# Patient Record
Sex: Female | Born: 1970 | Race: White | Hispanic: No | Marital: Married | Smoking: Never smoker
Health system: Southern US, Community
[De-identification: ages and names within clinical notes are randomized; demographics above are authoritative.]

## PROBLEM LIST (undated history)

## (undated) DIAGNOSIS — C50919 Malignant neoplasm of unspecified site of unspecified female breast: Secondary | ICD-10-CM

## (undated) DIAGNOSIS — Z803 Family history of malignant neoplasm of breast: Secondary | ICD-10-CM

## (undated) DIAGNOSIS — F419 Anxiety disorder, unspecified: Secondary | ICD-10-CM

## (undated) DIAGNOSIS — Z808 Family history of malignant neoplasm of other organs or systems: Secondary | ICD-10-CM

## (undated) HISTORY — DX: Malignant neoplasm of unspecified site of unspecified female breast: C50.919

## (undated) HISTORY — DX: Family history of malignant neoplasm of breast: Z80.3

## (undated) HISTORY — DX: Anxiety disorder, unspecified: F41.9

## (undated) HISTORY — PX: TUBAL LIGATION: SHX77

## (undated) HISTORY — DX: Family history of malignant neoplasm of other organs or systems: Z80.8

## (undated) HISTORY — PX: ABDOMINAL HYSTERECTOMY: SHX81

---

## 2007-11-22 ENCOUNTER — Encounter: Admission: RE | Admit: 2007-11-22 | Discharge: 2007-11-22 | Payer: Self-pay | Admitting: Obstetrics and Gynecology

## 2007-12-04 ENCOUNTER — Encounter: Admission: RE | Admit: 2007-12-04 | Discharge: 2007-12-04 | Payer: Self-pay | Admitting: Obstetrics and Gynecology

## 2008-12-04 ENCOUNTER — Encounter: Admission: RE | Admit: 2008-12-04 | Discharge: 2008-12-04 | Payer: Self-pay | Admitting: Obstetrics and Gynecology

## 2009-12-14 ENCOUNTER — Encounter: Admission: RE | Admit: 2009-12-14 | Discharge: 2009-12-14 | Payer: Self-pay | Admitting: Obstetrics and Gynecology

## 2010-06-13 ENCOUNTER — Encounter: Payer: Self-pay | Admitting: Obstetrics and Gynecology

## 2010-12-09 ENCOUNTER — Other Ambulatory Visit: Payer: Self-pay | Admitting: Obstetrics and Gynecology

## 2010-12-09 DIAGNOSIS — Z803 Family history of malignant neoplasm of breast: Secondary | ICD-10-CM

## 2010-12-09 DIAGNOSIS — Z1231 Encounter for screening mammogram for malignant neoplasm of breast: Secondary | ICD-10-CM

## 2011-01-06 ENCOUNTER — Ambulatory Visit
Admission: RE | Admit: 2011-01-06 | Discharge: 2011-01-06 | Disposition: A | Discharge status: Home / Self Care | Payer: BC Managed Care – PPO | Source: Ambulatory Visit | Attending: Obstetrics and Gynecology | Admitting: Obstetrics and Gynecology

## 2011-01-06 DIAGNOSIS — Z803 Family history of malignant neoplasm of breast: Secondary | ICD-10-CM

## 2011-01-06 DIAGNOSIS — Z1231 Encounter for screening mammogram for malignant neoplasm of breast: Secondary | ICD-10-CM

## 2018-07-04 ENCOUNTER — Telehealth: Payer: Self-pay | Admitting: Obstetrics & Gynecology

## 2018-07-04 ENCOUNTER — Other Ambulatory Visit: Payer: Self-pay | Admitting: Obstetrics & Gynecology

## 2018-07-04 DIAGNOSIS — Z1231 Encounter for screening mammogram for malignant neoplasm of breast: Secondary | ICD-10-CM

## 2018-08-03 ENCOUNTER — Ambulatory Visit
Admission: RE | Admit: 2018-08-03 | Discharge: 2018-08-03 | Disposition: A | Discharge status: Home / Self Care | Payer: 59 | Source: Ambulatory Visit | Attending: Obstetrics & Gynecology | Admitting: Obstetrics & Gynecology

## 2018-08-03 ENCOUNTER — Other Ambulatory Visit: Payer: Self-pay

## 2018-08-03 DIAGNOSIS — Z1231 Encounter for screening mammogram for malignant neoplasm of breast: Secondary | ICD-10-CM

## 2018-08-23 ENCOUNTER — Encounter: Payer: Self-pay | Admitting: Obstetrics & Gynecology

## 2019-02-27 ENCOUNTER — Telehealth: Payer: Self-pay | Admitting: Family Medicine

## 2019-02-27 NOTE — Telephone Encounter (Signed)
Called the patient to confirm the appointment. The patient answered no to the covid19 screening questions. Also advised the patient of no children or visitors due to covid19 restrictions. °

## 2019-02-28 ENCOUNTER — Other Ambulatory Visit: Payer: Self-pay

## 2019-02-28 ENCOUNTER — Ambulatory Visit: Payer: 59 | Admitting: Obstetrics & Gynecology

## 2019-02-28 VITALS — BP 117/82 | HR 72 | Wt 145.7 lb

## 2019-02-28 DIAGNOSIS — Z01419 Encounter for gynecological examination (general) (routine) without abnormal findings: Secondary | ICD-10-CM | POA: Diagnosis not present

## 2019-02-28 MED ORDER — NITROFURANTOIN MACROCRYSTAL 100 MG PO CAPS: ORAL_CAPSULE | ORAL | 3 refills | Status: DC

## 2019-02-28 MED ORDER — METRONIDAZOLE 500 MG PO TABS: 500.0000 mg | ORAL_TABLET | Freq: Two times a day (BID) | ORAL | 4 refills | Status: DC

## 2019-02-28 NOTE — Progress Notes (Signed)
Last breast exam may 2020

## 2019-02-28 NOTE — Progress Notes (Signed)
Subjective:    Dana Baxter is a 48 y.o. married P2 (79 and 78 yo kids) who presents for an annual exam. The patient has no complaints today. She gets an odor after sex. The patient is sexually active. GYN screening history: last pap: was normal. The patient wears seatbelts: yes. The patient participates in regular exercise: yes. Has the patient ever been transfused or tattooed?: no. The patient reports that there is not domestic violence in her life.   Menstrual History: OB History   No obstetric history on file.     Menarche age: 47 No LMP recorded.    The following portions of the patient's history were reviewed and updated as appropriate: allergies, current medications, past family history, past medical history, past social history, past surgical history and problem list.  Review of Systems Pertinent items are noted in HPI.   FH- + breast in her sister, diagnosed at 50 yo S/p TAH at 23 years old She does health screening/phlebotomy   Objective:    BP 117/82   Pulse 72   Wt 145 lb 11.2 oz (66.1 kg)   General Appearance:    Alert, cooperative, no distress, appears stated age  Head:    Normocephalic, without obvious abnormality, atraumatic  Eyes:    PERRL, conjunctiva/corneas clear, EOM's intact, fundi    benign, both eyes  Ears:    Normal TM's and external ear canals, both ears  Nose:   Nares normal, septum midline, mucosa normal, no drainage    or sinus tenderness  Throat:   Lips, mucosa, and tongue normal; teeth and gums normal  Neck:   Supple, symmetrical, trachea midline, no adenopathy;    thyroid:  no enlargement/tenderness/nodules; no carotid   bruit or JVD  Back:     Symmetric, no curvature, ROM normal, no CVA tenderness  Lungs:     Clear to auscultation bilaterally, respirations unlabored  Chest Wall:    No tenderness or deformity   Heart:    Regular rate and rhythm, S1 and S2 normal, no murmur, rub   or gallop  Breast Exam:    No tenderness, masses, or nipple  abnormality  Abdomen:     Soft, non-tender, bowel sounds active all four quadrants,    no masses, no organomegaly  Genitalia:    Normal female without lesion or tenderness, discharge c/w BV, spec exam otherwise normal, bimanual exam normal.     Extremities:   Extremities normal, atraumatic, no cyanosis or edema  Pulses:   2+ and symmetric all extremities  Skin:   Skin color, texture, turgor normal, no rashes or lesions  Lymph nodes:   Cervical, supraclavicular, and axillary nodes normal  Neurologic:   CNII-XII intact, normal strength, sensation and reflexes    throughout  .    Assessment:    Healthy female exam.    Plan:     Discussed healthy lifestyle modifications.   Fasting labs today Refer to GI and fam med Flagyl prescribed for bv and rec boric acid supp prn Refills for macrobid for use after sex fine.

## 2019-03-01 LAB — CBC
Hematocrit: 42.1 % (ref 34.0–46.6)
Hemoglobin: 14 g/dL (ref 11.1–15.9)
MCH: 33.3 pg — ABNORMAL HIGH (ref 26.6–33.0)
MCHC: 33.3 g/dL (ref 31.5–35.7)
MCV: 100 fL — ABNORMAL HIGH (ref 79–97)
Platelets: 236 10*3/uL (ref 150–450)
RBC: 4.21 x10E6/uL (ref 3.77–5.28)
RDW: 11.6 % — ABNORMAL LOW (ref 11.7–15.4)
WBC: 6.5 10*3/uL (ref 3.4–10.8)

## 2019-03-01 LAB — LIPID PANEL
Chol/HDL Ratio: 3.6 ratio (ref 0.0–4.4)
Cholesterol, Total: 237 mg/dL — ABNORMAL HIGH (ref 100–199)
HDL: 66 mg/dL (ref >39)
LDL Chol Calc (NIH): 153 mg/dL — ABNORMAL HIGH (ref 0–99)
Triglycerides: 105 mg/dL (ref 0–149)
VLDL Cholesterol Cal: 18 mg/dL (ref 5–40)

## 2019-03-01 LAB — VITAMIN D 25 HYDROXY (VIT D DEFICIENCY, FRACTURES): Vit D, 25-Hydroxy: 36.6 ng/mL (ref 30.0–100.0)

## 2019-03-01 LAB — TSH: TSH: 1.63 u[IU]/mL (ref 0.450–4.500)

## 2019-03-01 LAB — COMPREHENSIVE METABOLIC PANEL
ALT: 10 IU/L (ref 0–32)
AST: 15 IU/L (ref 0–40)
Albumin/Globulin Ratio: 2 (ref 1.2–2.2)
Albumin: 4.3 g/dL (ref 3.8–4.8)
Alkaline Phosphatase: 68 IU/L (ref 39–117)
BUN/Creatinine Ratio: 15 (ref 9–23)
BUN: 12 mg/dL (ref 6–24)
Bilirubin Total: 0.3 mg/dL (ref 0.0–1.2)
CO2: 22 mmol/L (ref 20–29)
Calcium: 9.4 mg/dL (ref 8.7–10.2)
Chloride: 103 mmol/L (ref 96–106)
Creatinine, Ser: 0.82 mg/dL (ref 0.57–1.00)
GFR calc Af Amer: 96 mL/min/{1.73_m2} (ref >59)
GFR calc non Af Amer: 84 mL/min/{1.73_m2} (ref >59)
Globulin, Total: 2.1 g/dL (ref 1.5–4.5)
Glucose: 89 mg/dL (ref 65–99)
Potassium: 4.2 mmol/L (ref 3.5–5.2)
Sodium: 139 mmol/L (ref 134–144)
Total Protein: 6.4 g/dL (ref 6.0–8.5)

## 2019-12-16 ENCOUNTER — Other Ambulatory Visit: Payer: Self-pay | Admitting: Obstetrics & Gynecology

## 2019-12-16 DIAGNOSIS — Z1231 Encounter for screening mammogram for malignant neoplasm of breast: Secondary | ICD-10-CM

## 2020-01-01 ENCOUNTER — Ambulatory Visit
Admission: RE | Admit: 2020-01-01 | Discharge: 2020-01-01 | Disposition: A | Discharge status: Home / Self Care | Payer: 59 | Source: Ambulatory Visit | Attending: *Deleted | Admitting: *Deleted

## 2020-01-01 ENCOUNTER — Other Ambulatory Visit: Payer: Self-pay

## 2020-01-01 DIAGNOSIS — Z1231 Encounter for screening mammogram for malignant neoplasm of breast: Secondary | ICD-10-CM

## 2022-03-20 IMAGING — MG DIGITAL SCREENING BILAT W/ TOMO W/ CAD
8 series · 8 of 24 positions shown · non-contrast
Comparison: Previous exam(s).

CLINICAL DATA: Screening.

EXAM:
DIGITAL SCREENING BILATERAL MAMMOGRAM WITH TOMO AND CAD

[L CC synth-2D]
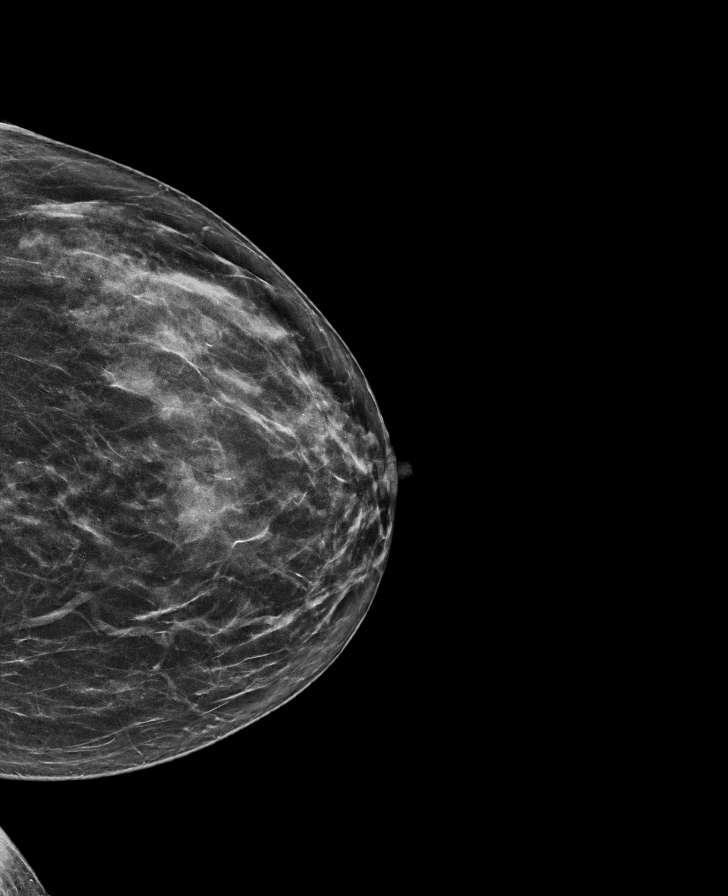

[R CC synth-2D]
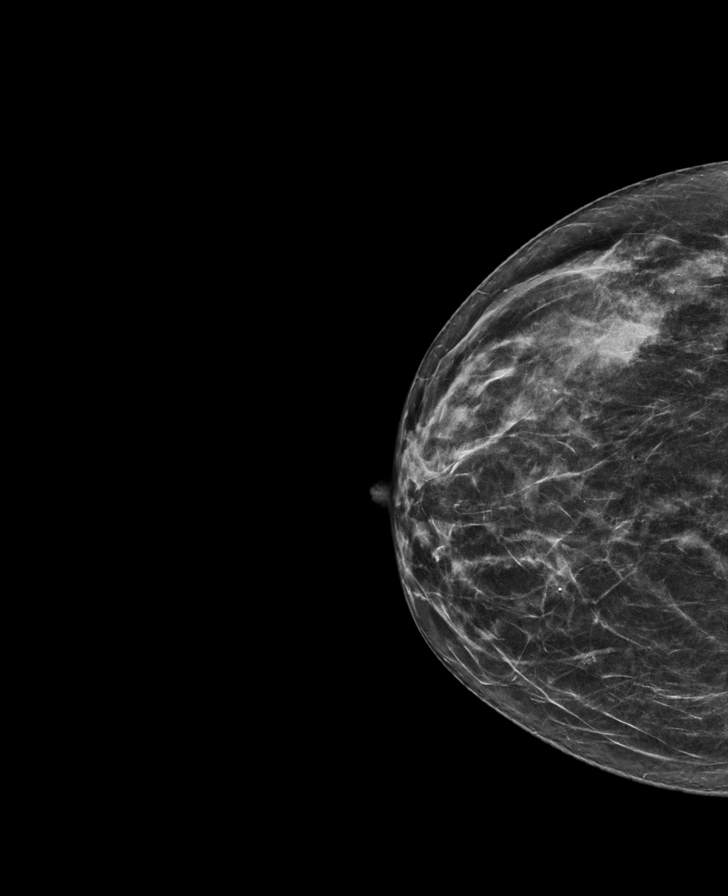

[R MLO synth-2D]
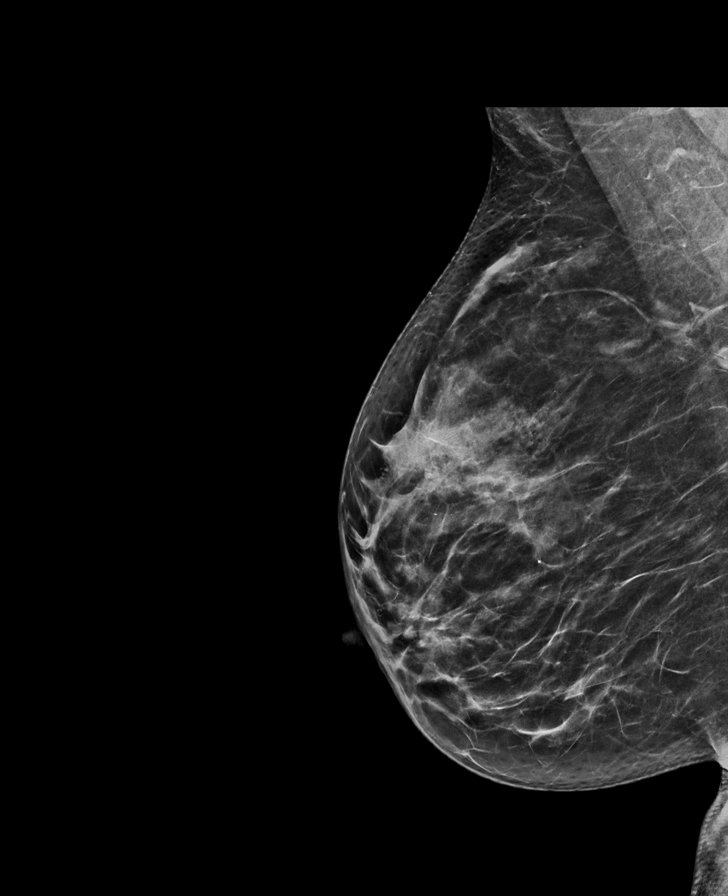

[L MLO synth-2D]
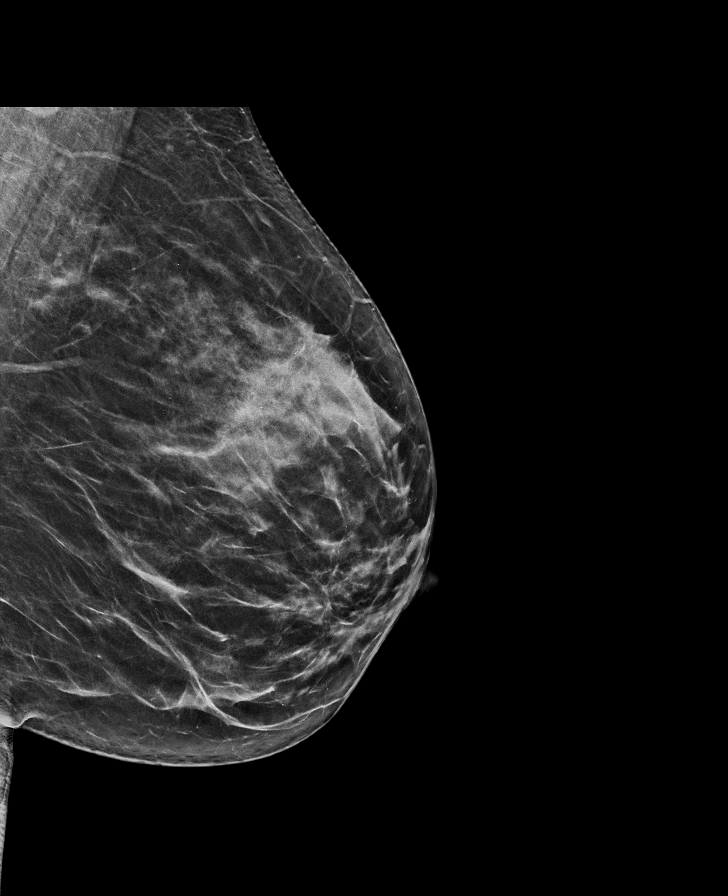

[R CC tomo · tomo slice 31/60.0]
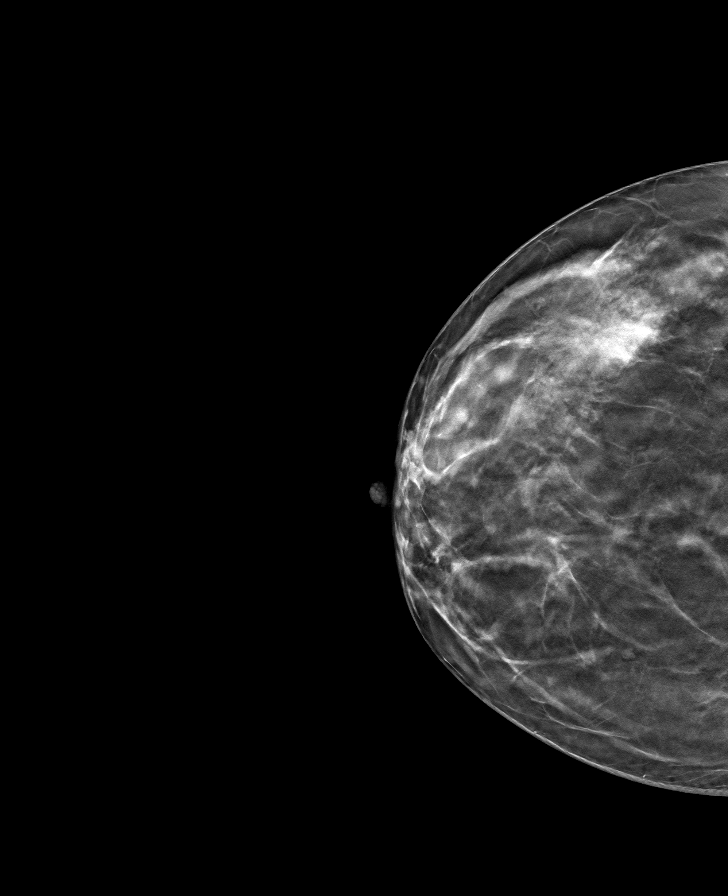

[R MLO tomo · tomo slice 33/66.0]
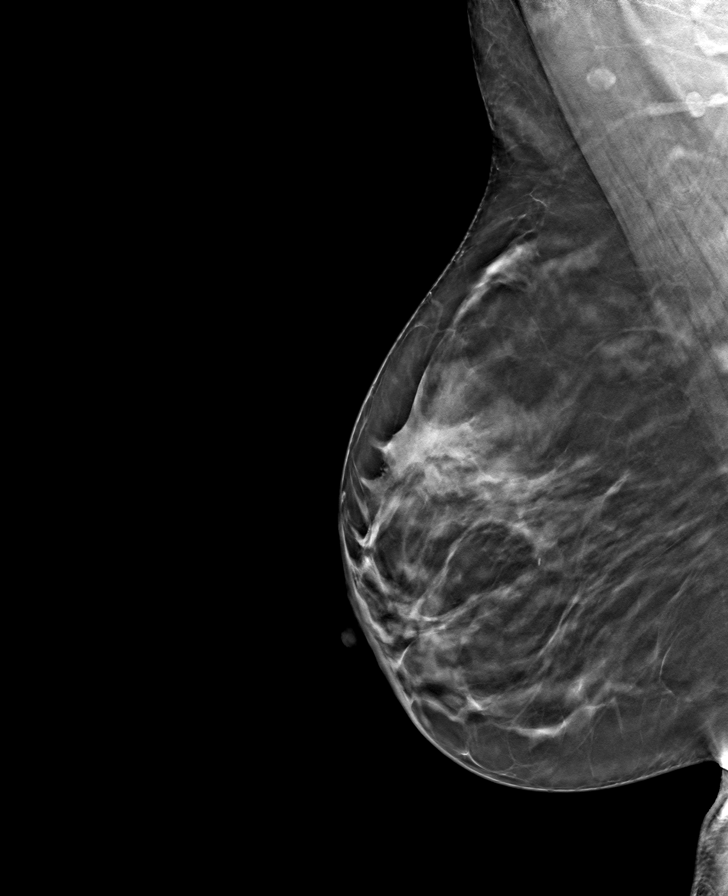

[L CC tomo · tomo slice 33/65.0]
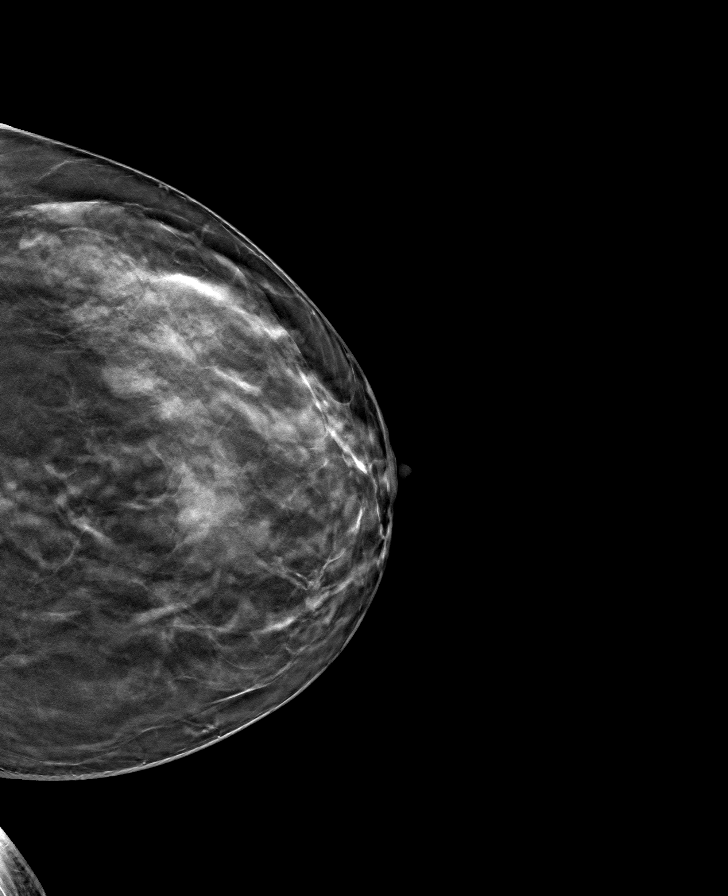

[L MLO tomo · tomo slice 33/66.0]
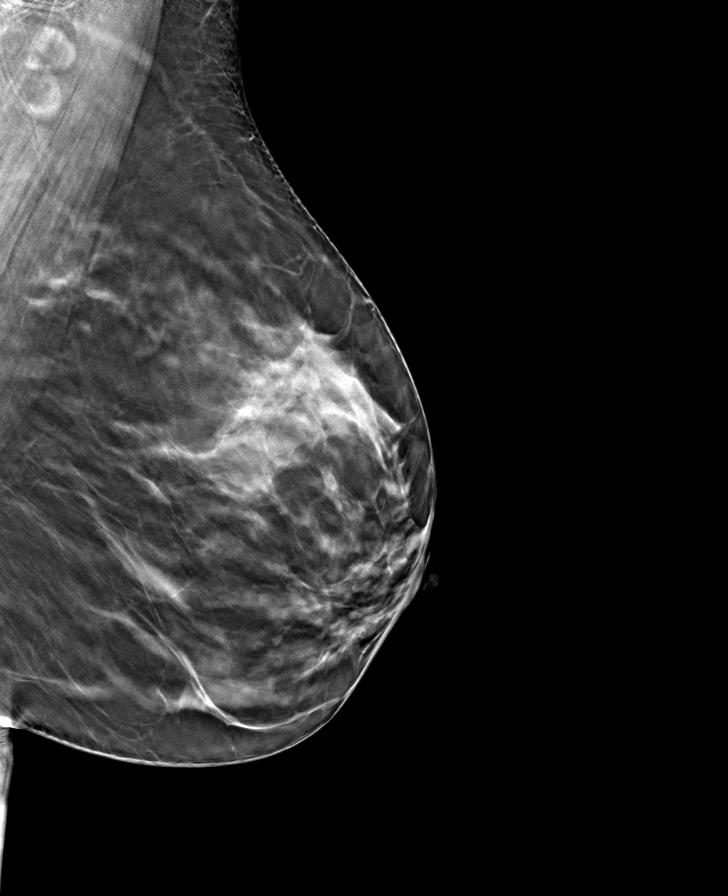

[8 of 24 positions shown; findings below may reference images not displayed]

ACR Breast Density Category c: The breast tissue is heterogeneously
dense, which may obscure small masses.
FINDINGS: There are no findings suspicious for malignancy. Images were
processed with CAD.
IMPRESSION: No mammographic evidence of malignancy. A result letter of this
screening mammogram will be mailed directly to the patient.

RECOMMENDATION:
Screening mammogram in one year. (Code:[id number])

BI-RADS CATEGORY  1: Negative.

## 2022-04-12 ENCOUNTER — Other Ambulatory Visit: Payer: Self-pay | Admitting: Family Medicine

## 2022-04-12 DIAGNOSIS — R928 Other abnormal and inconclusive findings on diagnostic imaging of breast: Secondary | ICD-10-CM

## 2022-05-02 ENCOUNTER — Ambulatory Visit
Admission: RE | Admit: 2022-05-02 | Discharge: 2022-05-02 | Disposition: A | Discharge status: Home / Self Care | Payer: No Typology Code available for payment source | Source: Ambulatory Visit | Attending: Family Medicine | Admitting: Family Medicine

## 2022-05-02 ENCOUNTER — Ambulatory Visit: Payer: No Typology Code available for payment source

## 2022-05-02 DIAGNOSIS — R928 Other abnormal and inconclusive findings on diagnostic imaging of breast: Secondary | ICD-10-CM

## 2023-04-03 NOTE — Telephone Encounter (Signed)
error 

## 2023-04-14 ENCOUNTER — Other Ambulatory Visit: Payer: Self-pay

## 2023-04-14 DIAGNOSIS — Z1231 Encounter for screening mammogram for malignant neoplasm of breast: Secondary | ICD-10-CM

## 2023-05-22 ENCOUNTER — Ambulatory Visit
Admission: RE | Admit: 2023-05-22 | Discharge: 2023-05-22 | Disposition: A | Discharge status: Home / Self Care | Payer: No Typology Code available for payment source | Source: Ambulatory Visit

## 2023-05-22 DIAGNOSIS — Z1231 Encounter for screening mammogram for malignant neoplasm of breast: Secondary | ICD-10-CM

## 2024-02-12 ENCOUNTER — Other Ambulatory Visit: Payer: Self-pay | Admitting: Registered Nurse

## 2024-02-12 DIAGNOSIS — N6311 Unspecified lump in the right breast, upper outer quadrant: Secondary | ICD-10-CM

## 2024-02-22 ENCOUNTER — Other Ambulatory Visit: Payer: Self-pay | Admitting: Registered Nurse

## 2024-02-22 ENCOUNTER — Ambulatory Visit
Admission: RE | Admit: 2024-02-22 | Discharge: 2024-02-22 | Disposition: A | Source: Ambulatory Visit | Attending: Registered Nurse | Admitting: Registered Nurse

## 2024-02-22 ENCOUNTER — Ambulatory Visit
Admission: RE | Admit: 2024-02-22 | Discharge: 2024-02-22 | Disposition: A | Discharge status: Home / Self Care | Source: Ambulatory Visit | Attending: Registered Nurse | Admitting: Registered Nurse

## 2024-02-22 DIAGNOSIS — N6311 Unspecified lump in the right breast, upper outer quadrant: Secondary | ICD-10-CM

## 2024-02-22 DIAGNOSIS — R599 Enlarged lymph nodes, unspecified: Secondary | ICD-10-CM

## 2024-02-22 DIAGNOSIS — N6310 Unspecified lump in the right breast, unspecified quadrant: Secondary | ICD-10-CM

## 2024-02-26 ENCOUNTER — Ambulatory Visit
Admission: RE | Admit: 2024-02-26 | Discharge: 2024-02-26 | Disposition: A | Discharge status: Home / Self Care | Source: Ambulatory Visit | Attending: Registered Nurse | Admitting: Registered Nurse

## 2024-02-26 ENCOUNTER — Other Ambulatory Visit: Payer: Self-pay | Admitting: Registered Nurse

## 2024-02-26 ENCOUNTER — Ambulatory Visit
Admission: RE | Admit: 2024-02-26 | Discharge: 2024-02-26 | Disposition: A | Source: Ambulatory Visit | Attending: Registered Nurse | Admitting: Registered Nurse

## 2024-02-26 DIAGNOSIS — N6310 Unspecified lump in the right breast, unspecified quadrant: Secondary | ICD-10-CM

## 2024-02-26 DIAGNOSIS — R928 Other abnormal and inconclusive findings on diagnostic imaging of breast: Secondary | ICD-10-CM

## 2024-02-26 DIAGNOSIS — R599 Enlarged lymph nodes, unspecified: Secondary | ICD-10-CM

## 2024-02-26 HISTORY — PX: BREAST BIOPSY: SHX20

## 2024-02-27 LAB — SURGICAL PATHOLOGY

## 2024-02-28 ENCOUNTER — Telehealth: Payer: Self-pay | Admitting: *Deleted

## 2024-02-28 NOTE — Telephone Encounter (Signed)
 Spoke to patient to confirm upcoming afternoon [facility] clinic appointment on 10/15, paperwork will be sent via email.  Gave location and time, also informed patient that the surgeon's office would be calling as well to get information from them similar to the packet that they will be receiving so make sure to do both.  Reminded patient that all providers will be coming to the clinic to see them HERE and if they had any questions to not hesitate to reach back out to myself or their navigators.

## 2024-03-04 ENCOUNTER — Encounter: Payer: Self-pay | Admitting: *Deleted

## 2024-03-04 ENCOUNTER — Other Ambulatory Visit: Payer: Self-pay | Admitting: *Deleted

## 2024-03-04 DIAGNOSIS — Z170 Estrogen receptor positive status [ER+]: Secondary | ICD-10-CM | POA: Insufficient documentation

## 2024-03-05 ENCOUNTER — Telehealth: Payer: Self-pay

## 2024-03-05 NOTE — Telephone Encounter (Signed)
 Spoke with patient and confirmed appointment on 10/15 with breast clinic.  Advised to bring paperwork filled out as well.Dana Baxter

## 2024-03-06 ENCOUNTER — Other Ambulatory Visit: Payer: Self-pay | Admitting: Hematology and Oncology

## 2024-03-06 ENCOUNTER — Encounter: Payer: Self-pay | Admitting: *Deleted

## 2024-03-06 ENCOUNTER — Inpatient Hospital Stay: Attending: Hematology and Oncology

## 2024-03-06 ENCOUNTER — Encounter: Payer: Self-pay | Admitting: Physical Therapy

## 2024-03-06 ENCOUNTER — Inpatient Hospital Stay

## 2024-03-06 ENCOUNTER — Other Ambulatory Visit: Payer: Self-pay

## 2024-03-06 ENCOUNTER — Ambulatory Visit
Admission: RE | Admit: 2024-03-06 | Discharge: 2024-03-06 | Disposition: A | Discharge status: Home / Self Care | Source: Ambulatory Visit | Attending: Radiation Oncology | Admitting: Radiation Oncology

## 2024-03-06 ENCOUNTER — Ambulatory Visit: Attending: General Surgery | Admitting: Physical Therapy

## 2024-03-06 ENCOUNTER — Other Ambulatory Visit: Payer: Self-pay | Admitting: *Deleted

## 2024-03-06 ENCOUNTER — Inpatient Hospital Stay (HOSPITAL_BASED_OUTPATIENT_CLINIC_OR_DEPARTMENT_OTHER): Admitting: Hematology and Oncology

## 2024-03-06 VITALS — BP 108/78 | HR 81 | Temp 98.0°F | Resp 18 | Wt 117.6 lb

## 2024-03-06 DIAGNOSIS — Z803 Family history of malignant neoplasm of breast: Secondary | ICD-10-CM | POA: Insufficient documentation

## 2024-03-06 DIAGNOSIS — Z1732 Human epidermal growth factor receptor 2 negative status: Secondary | ICD-10-CM | POA: Diagnosis not present

## 2024-03-06 DIAGNOSIS — C50411 Malignant neoplasm of upper-outer quadrant of right female breast: Secondary | ICD-10-CM | POA: Insufficient documentation

## 2024-03-06 DIAGNOSIS — C50611 Malignant neoplasm of axillary tail of right female breast: Secondary | ICD-10-CM

## 2024-03-06 DIAGNOSIS — Z170 Estrogen receptor positive status [ER+]: Secondary | ICD-10-CM | POA: Insufficient documentation

## 2024-03-06 DIAGNOSIS — Z923 Personal history of irradiation: Secondary | ICD-10-CM | POA: Diagnosis not present

## 2024-03-06 DIAGNOSIS — Z5111 Encounter for antineoplastic chemotherapy: Secondary | ICD-10-CM | POA: Insufficient documentation

## 2024-03-06 DIAGNOSIS — Z808 Family history of malignant neoplasm of other organs or systems: Secondary | ICD-10-CM

## 2024-03-06 DIAGNOSIS — Z79899 Other long term (current) drug therapy: Secondary | ICD-10-CM | POA: Insufficient documentation

## 2024-03-06 DIAGNOSIS — Z1722 Progesterone receptor negative status: Secondary | ICD-10-CM | POA: Diagnosis not present

## 2024-03-06 DIAGNOSIS — R293 Abnormal posture: Secondary | ICD-10-CM | POA: Insufficient documentation

## 2024-03-06 LAB — CBC WITH DIFFERENTIAL (CANCER CENTER ONLY)
Abs Immature Granulocytes: 0.01 K/uL (ref 0.00–0.07)
Basophils Absolute: 0 K/uL (ref 0.0–0.1)
Basophils Relative: 1 %
Eosinophils Absolute: 0 K/uL (ref 0.0–0.5)
Eosinophils Relative: 0 %
HCT: 36.6 % (ref 36.0–46.0)
Hemoglobin: 12.5 g/dL (ref 12.0–15.0)
Immature Granulocytes: 0 %
Lymphocytes Relative: 36 %
Lymphs Abs: 2.2 K/uL (ref 0.7–4.0)
MCH: 33.2 pg (ref 26.0–34.0)
MCHC: 34.2 g/dL (ref 30.0–36.0)
MCV: 97.1 fL (ref 80.0–100.0)
Monocytes Absolute: 0.5 K/uL (ref 0.1–1.0)
Monocytes Relative: 8 %
Neutro Abs: 3.3 K/uL (ref 1.7–7.7)
Neutrophils Relative %: 55 %
Platelet Count: 256 K/uL (ref 150–400)
RBC: 3.77 MIL/uL — ABNORMAL LOW (ref 3.87–5.11)
RDW: 12.3 % (ref 11.5–15.5)
WBC Count: 6 K/uL (ref 4.0–10.5)
nRBC: 0 % (ref 0.0–0.2)

## 2024-03-06 LAB — CMP (CANCER CENTER ONLY)
ALT: 21 U/L (ref 0–44)
AST: 16 U/L (ref 15–41)
Albumin: 4.1 g/dL (ref 3.5–5.0)
Alkaline Phosphatase: 56 U/L (ref 38–126)
Anion gap: 5 (ref 5–15)
BUN: 17 mg/dL (ref 6–20)
CO2: 30 mmol/L (ref 22–32)
Calcium: 9.2 mg/dL (ref 8.9–10.3)
Chloride: 103 mmol/L (ref 98–111)
Creatinine: 0.82 mg/dL (ref 0.44–1.00)
GFR, Estimated: >60 mL/min (ref >60)
Glucose, Bld: 99 mg/dL (ref 70–99)
Potassium: 3.9 mmol/L (ref 3.5–5.1)
Sodium: 138 mmol/L (ref 135–145)
Total Bilirubin: 0.4 mg/dL (ref 0.0–1.2)
Total Protein: 6.4 g/dL — ABNORMAL LOW (ref 6.5–8.1)

## 2024-03-06 LAB — GENETIC SCREENING ORDER

## 2024-03-06 MED ORDER — LIDOCAINE-PRILOCAINE 2.5-2.5 % EX CREA: TOPICAL_CREAM | CUTANEOUS | 3 refills | Status: AC

## 2024-03-06 MED ORDER — DEXAMETHASONE 4 MG PO TABS: ORAL_TABLET | ORAL | 1 refills | Status: AC

## 2024-03-06 MED ORDER — ONDANSETRON HCL 8 MG PO TABS: 8.0000 mg | ORAL_TABLET | Freq: Three times a day (TID) | ORAL | 1 refills | Status: AC | PRN

## 2024-03-06 MED ORDER — PROCHLORPERAZINE MALEATE 10 MG PO TABS
10.0000 mg | ORAL_TABLET | Freq: Four times a day (QID) | ORAL | 1 refills | Status: AC | PRN
Start: 2024-03-06 — End: ?

## 2024-03-06 NOTE — Progress Notes (Unsigned)
 REFERRING PROVIDER: Ebbie Cough, MD [address] [place],  [place] [address]  PRIMARY PROVIDER:  Prentiss Spanner, MD  PRIMARY REASON FOR VISIT:  No diagnosis found.  HISTORY OF PRESENT ILLNESS:   Dana Baxter, a 53 y.o. female, was seen for a [place] cancer genetics consultation at the request of Cough Ebbie, MD due to a personal history of breast cancer. Dana Baxter presents today the at the [facility] to discuss the possibility of a hereditary predisposition to cancer, genetic testing, and to further clarify her future cancer risks, as well as potential cancer risks for family members.  Diagnosis: In October 2025, at the age of 43, Dana Baxter was diagnosed with ***two primary breast cancers in the right breast. 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 The treatment plan includes***.   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  First live birth at age 16.  ***OCP use for approximately {Numbers 1-12 multi-select:20307} years.  Ovaries intact: {Yes/No-Ex:120004}.  Hysterectomy: Partial Hysterectomy.  Menopausal status: perimenopausal. Last period in 2004 (at 61) HRT use: 0 years. Colonoscopy: {Yes/No-Ex:120004}; {normal/abnormal/not examined:14677}. Mammogram within the last year: {Yes/No-Ex:120004}. Number of breast biopsies: {Numbers 1-12 multi-select:20307}. Up to date with pelvic exams:  {Yes/No-Ex:120004}. Any excessive radiation exposure in the past: {Yes/No-Ex:120004} Tobacco Use: ***Current***Former***Never No past medical history on file.  Past Surgical History:  Procedure Laterality Date   BREAST BIOPSY Right 02/26/2024   US  RT BREAST BX W LOC DEV EA ADD LESION IMG BX SPEC US  GUIDE 02/26/2024 [facility]   BREAST BIOPSY Right 02/26/2024   US  RT BREAST BX W LOC DEV 1ST LESION IMG BX SPEC US  GUIDE 02/26/2024 [facility]    Social History   Socioeconomic History   Marital status: Married    Spouse name: Not on file   Number of children: Not on file   Years of education: Not on file   Highest education level: Not on file  Occupational History   Not on file  Tobacco Use   Smoking status: Not on file   Smokeless tobacco: Not on file  Substance and Sexual Activity   Alcohol use: Not on file   Drug use: Not on file   Sexual activity: Not on file  Other Topics Concern   Not on file  Social History Narrative   Not on file   Social Drivers of Health   Financial Resource Strain: Not on file  Food Insecurity: No Food Insecurity (03/05/2024)   Hunger Vital Sign    Worried About Running Out of Food in the Last Year: Never true    Ran Out of Food in the Last Year: Never true  Transportation Needs: No Transportation Needs (03/05/2024)   PRAPARE - Administrator, Civil Service (Medical): No    Lack of Transportation (Non-Medical): No  Physical Activity: Not on file  Stress: Not on file  Social Connections: Not on file     FAMILY HISTORY:  We obtained a detailed,  4-generation family history.  Significant diagnoses are listed below: Family History  Problem Relation Age of Onset   Arthritis Mother    Asthma Mother    Varicose Veins Mother    Hypertension Father    Breast cancer Sister 12   Heart disease Sister     Pedigree Summary:  Dana Baxter has a family history of *** Dana Baxter is ***aware ***unaware of relatives completing  genetic testing for hereditary cancer risks.  ***Patient's maternal ancestors are of *** descent, and paternal ancestors are of *** descent.  There {IS NO:12509} reported Ashkenazi Jewish ancestry.  There ***is no known consanguinity.  GENETIC COUNSELING ASSESSMENT: Dana Baxter is a 53 y.o. female with a {Personal/family:20331} history of {cancer/polyps} which is somewhat suggestive of a hereditary cancer predisposition syndrome*** given ***. We, therefore, discussed and recommended the following at today's visit.   DISCUSSION: We discussed that, in general, most cancer is not inherited in families, but instead is sporadic or familial. Sporadic cancers occur by chance and typically happen at older ages (>50 years) as this type of cancer is caused by genetic changes acquired during an individual's lifetime. Some families have more cancers than would be expected by chance; however, the ages or types of cancer are not consistent with a known genetic mutation or known genetic mutations have been ruled out. This type of familial cancer is thought to be due to a combination of multiple genetic, environmental, hormonal, and lifestyle factors. While this combination of factors likely increases the risk of cancer, the exact source of this risk is not currently identifiable or testable.  We discussed that 5-10% of cancer is the result of germline (heritable) genetic variants, with most cases associated with ***BRCA1/BRCA2.  There are other genes that can be associated with hereditary *** cancer syndromes.  These include ***.  We discussed that testing is beneficial for several reasons including knowing how to follow individuals after completing their treatment, identifying whether potential treatment options such as PARP inhibitors would be beneficial, and understanding if other family members could be at risk for cancer and allow them to undergo genetic testing.   We reviewed the characteristics, features and  inheritance patterns of hereditary cancer syndromes. We also discussed genetic testing, including the appropriate family members to test, the process of testing, insurance coverage and turn-around-time for results. We discussed the implications of a negative, positive, carrier and/or variant of uncertain significant result. Dana Baxter  was offered a common hereditary cancer panel (***40 ***48 genes) and an expanded pan-cancer panel (***70***76 genes). Dana Baxter was informed of the benefits and limitations of each panel, including that expanded pan-cancer panels contain genes that do not have clear management guidelines at this point in time.  We also discussed that as the number of genes included on a panel increases, the chances of variants of uncertain significance increases.  GENETIC TESTING CONSENT:  After considering the risks, benefits, and limitations, Ms. Spenser did NOT provide consent pursue genetic testing. A blood sample was sent to [facility] for analysis of the ***CancerNext-Expanded+RNA Panel. Results should be available within approximately ***2-3 weeks' time, at which point they will be disclosed by telephone to Dana Baxter , as will any additional recommendations warranted by these results. Dana Baxter will receive a summary of her genetic counseling visit and a copy of her results once available. This information will also be available in Epic.  ***{INSERT}.kppinvtae ***{INSERT} .kppambry ***GENETIC TESTING NATIONAL CRITERIA: Based on Dana Baxter's {Personal/family:20331} history of cancer she  meets medical criteria for genetic testing based on the [employer] (NCCN) guidelines. ***Though Dana Baxter is not personally affected, there are no affected family members that are willing/able/available to undergo hereditary cancer testing. Therefore, Dana Baxter the most informative family member available. ***Despite that she meets criteria, she may still have an out of  pocket cost. We discussed that if her out of pocket cost for testing is over $100, the laboratory will call and confirm whether she wants to proceed with testing.  If the out of pocket cost of testing is less than $100 she will be billed by the genetic testing laboratory.   ***DOES NOT MEET NCCN CRITERIA ***We discussed with Dana Baxter that the {Personal/family:20331} history does not meet insurance or NCCN criteria for genetic testing and, therefore, is not highly consistent with a familial hereditary cancer syndrome.  We feel she is at low risk to harbor  a gene mutation associated with such a condition. Thus, we did not recommend any genetic testing, at this time, and recommended Dana Baxter continue to follow the cancer screening guidelines given by her primary healthcare provider.  ***STAT TESTING ***We reviewed the characteristics, features and inheritance patterns of hereditary cancer syndromes. We also discussed genetic testing, including the appropriate family members to test, the process of testing, insurance coverage and turn-around-time for results. We discussed the implications of a negative, positive and/or variant of uncertain significant result. In order to get genetic test results in a timely manner so that Dana Baxter can use these genetic test results for surgical decisions, we recommended Dana Baxter pursue genetic testing for the ***. Once complete, we recommend Dana Baxter pursue reflex genetic testing to the *** gene panel.   GENETIC INFORMATION NONDISCRIMINATION ACT (GINA): We discussed that some people do not want to undergo genetic testing due to fear of genetic discrimination.  The Genetic Information Nondiscrimination Act (GINA) was signed into federal law in 2008. GINA prohibits health insurers and most employers from discriminating against individuals based on genetic information (including the results of genetic tests and family history information). According to GINA, health insurance  companies cannot consider genetic information to be a preexisting condition, nor can they use it to make decisions regarding coverage or rates. GINA also makes it illegal for most employers to use genetic information in making decisions about hiring, firing, promotion, or terms of employment. It is important to note that GINA does not offer protections for life insurance, disability insurance, or long-term care insurance. GINA does not apply to those in the [employer], those who work for companies with less than 15 employees, and new life insurance or long-term disability insurance policies.  Health status due to a cancer diagnosis is not protected under GINA. More information about GINA can be found by visiting [email or web address].  ***Statistical Models ***In order to estimate her chance of having a {CA GENE:62345} mutation, we used statistical models ({GENMODELS:62370}) that consider her personal medical history, family history and ancestry.  Because each model is different, there can be a lot of variability in the risks they give.  Therefore, these numbers must be considered a rough range and not a precise risk of having a {CA GENE:62345} mutation.  These models estimate that she has approximately a ***-***% chance of having a mutation. Based on this assessment of her family and personal history, genetic testing {IS/ISNOT:34056} recommended.  ***The Tyrer-Cuzick model is one of multiple prediction models developed to estimate an individual's lifetime risk of developing breast cancer. The Tyrer-Cuzick  model is endorsed by the [employer] (NCCN). This model includes many risk factors such as family history, endogenous estrogen exposure, and benign breast disease. The calculation is highly-dependent on the accuracy of clinical data provided by the patient and can change over time. The Tyrer-Cuzick model may be repeated to reflect new information in her personal or family history in the  future.   ***Based on the patient's {Personal/family:20331} history, a statistical model ({GENMODELS:62370}) was used to estimate her risk of developing {CA HX:54794}. This estimates her lifetime risk of developing {CA HX:54794} to be approximately ***%. This estimation does not consider any genetic testing results.  The patient's lifetime breast cancer risk is a preliminary estimate based on available information using one of several models endorsed by the American Cancer Society (ACS). The ACS recommends consideration of breast MRI screening as an adjunct to mammography for patients at high risk (defined as 20% or greater lifetime risk). Please note that a woman's breast cancer risk changes over time. It may increase or decrease based on age and any changes to the personal and/or family medical history. The risks and recommendations listed above apply to this patient at this point in time. In the future, she may or may not be eligible for the same medical management strategies and, in some cases, other medical management strategies may become available to her. If she is interested in an updated breast cancer risk assessment at a later date, she can contact us .  ***Ms. Pond has been determined to be at high risk for breast cancer.  her Tyrer-Cuzick risk score is ***%.  For women with a greater than 20% lifetime risk of breast cancer, the [employer] (NCCN) recommends the following:  1.      Clinical encounter every 6-12 months to begin when identified as being at increased risk, but not before age 30  2.      Annual mammograms. Tomosynthesis is recommended starting 10 years earlier than the youngest breast cancer diagnosis in the family or at age 42 (whichever comes first), but not before age 59   77.      Annual breast MRI starting 10 years earlier than the youngest breast cancer diagnosis in the family or at age 39 (whichever comes first), but not before age 40.    Lastly, we  encouraged Ms. Mcgurk to remain in contact with cancer genetics annually so that we can continuously update the family history and inform her of any changes in cancer genetics and testing that may be of benefit for this family.   Ms. Heward questions were answered to her satisfaction today. Our contact information was provided should additional questions or concerns arise. Thank you for the referral and allowing us  to share in the care of your patient.   Resources:  Ms. Israelson was provided with the following:  ***[facility] Billing information  ***[facility] Hereditary Cancer Testing Patient Guide PLAN:  ***Testing Ordered: ***Clinic Note Faxed/Routed to Ms. Treanor's PCP ***Prentiss Spanner, MD  ***Declined Testing *** Despite our recommendation, Ms. Sebek did not wish to pursue genetic testing at today's visit. We understand this decision and remain available to coordinate genetic testing at any time in the future. We, therefore, recommend Ms. Prettyman continue to follow the cancer screening guidelines given by her primary healthcare provider.  ***MOST INFORMATIVE PERSON TO TEST ***Based on Ms. Orsino's family history, we recommended her ***, who was diagnosed with *** at age ***, have genetic counseling and testing.  Ms. Shipton will let us  know if we can be of any assistance in coordinating genetic counseling and/or testing for this family member.   ***Signature   I personally spent a total of *** minutes in the care of the patient today including {Time Based Coding:210964241}.  *** The patient was seen alone.  ***The patient brought ***. Drs. Lanny Stalls, and/or Gudena were available for questions, if needed.  _______________________________________________________________________ For Office Staff:  Number of people involved in session: *** Was an Intern/ student involved with case: {YES/NO:63}

## 2024-03-06 NOTE — Research (Signed)
 Effectiveness of Out-of-Pocket Psychologist, forensic ([employer]) in Cancer Patients  Patient Dana Baxter was identified by Dr. Gudena as a potential candidate for the above listed study.  This Clinical Research Coordinator met with Martha Ellerby, [id number], on 03/06/24 in a manner and location that ensures patient privacy to discuss participation in the above listed research study.  Patient is Accompanied by her husband.  A copy of the informed consent document and separate HIPAA Authorization was provided to the patient.  Patient reads, speaks, and understands [place].   Patient was provided with the business card of this Coordinator and encouraged to contact the research team with any questions.  Approximately 10 minutes were spent with the patient reviewing the informed consent documents.  Patient was provided the option of taking informed consent documents home to review and was encouraged to review at their convenience with their support network, including other care providers. Patient took the consent documents home to review.   Abelardo Jock Clinical Research Coordinator [phone] 03/06/2024 3:56 PM

## 2024-03-06 NOTE — Assessment & Plan Note (Signed)
 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  Axillary tail mass ER 40%, PR 30%, HER2 positive, Ki-67 50%  Pathology and radiology counseling: Discussed with the patient, the details of pathology including the type of breast cancer,the clinical staging, the significance of ER, PR and HER-2/neu receptors and the implications for treatment. After reviewing the pathology in detail, we proceeded to discuss the different treatment options between surgery, radiation, chemotherapy, antiestrogen therapies.  Recommendation based on multidisciplinary tumor board: 1. Neoadjuvant chemotherapy with TCH Perjeta 6 cycles followed by Herceptin Perjeta maintenance versus Kadcyla maintenance (based on response to neoadjuvant chemo) for 1 year 2. Followed by breast conserving surgeries with targeted lymph node dissection 3. Followed by adjuvant radiation therapy if patient had lumpectomy 4.  Antiestrogen therapy based on final pathology  Chemotherapy Counseling: I discussed the risks and benefits of chemotherapy including the risks of nausea/ vomiting, risk of infection from low WBC count, fatigue due to chemo or anemia, bruising or bleeding due to low platelets, mouth sores, loss/ change in taste and decreased appetite. Liver and kidney function will be monitored through out chemotherapy as abnormalities in liver and kidney function may be a side effect of treatment. Cardiac dysfunction due to Herceptin and Perjeta and neuropathy risk from Taxotere were discussed in detail. Risk of permanent bone marrow dysfunction due to chemo were also discussed.  Plan: 1. Port placement 2. Echocardiogram 3. Chemotherapy class 4. Breast MRI  Return to clinic in 2 weeks to start chemotherapy.

## 2024-03-06 NOTE — Progress Notes (Signed)
 [facility] CONSULT NOTE  Patient Care Team: Prentiss Spanner, MD as PCP - Diedre Collet, Devere HERO, RN as Oncology Nurse Navigator Tyree Nanetta SAILOR, RN as Oncology Nurse Navigator Ebbie Cough, MD as Consulting Physician (General Surgery) Odean Potts, MD as Consulting Physician (Hematology and Oncology) Shannon Agent, MD as Consulting Physician (Radiation Oncology)  CHIEF COMPLAINTS/PURPOSE OF CONSULTATION:  Newly diagnosed breast cancer  HISTORY OF PRESENTING ILLNESS: Patient palpated 2 masses in the right breast.  First mass 12 o'clock position irregular spiculated 2.7 cm.  Axillary tail mass measured 2.6 cm.  She also had 1 abnormal lymph node which was positive.  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  The axillary tail mass showed an ER 40% PR 30% HER2 positive and Ki67 of 50%.  She was presented this morning to the multidiscipline tumor board and she is here today to discuss her treatment plan accompanied by her husband.   I reviewed her records extensively and collaborated the history with the patient.  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  Axillary tail mass ER 40%, PR 30%, HER2 positive, Ki-67 50%   03/06/2024 Cancer Staging   Staging form: Breast, AJCC 8th Edition -  Clinical: Stage IIB (cT2, cN1, cM0, G2, ER-, PR-, HER2+) - Signed by Odean Potts, MD on 03/06/2024 Stage prefix: Initial diagnosis Histologic grading system: 3 grade system      MEDICAL HISTORY:  No past medical history on file.  SURGICAL HISTORY: Past Surgical History:  Procedure Laterality Date   BREAST BIOPSY Right 02/26/2024   US  RT BREAST BX W LOC DEV EA ADD LESION IMG BX SPEC US  GUIDE 02/26/2024 [facility]   BREAST BIOPSY Right 02/26/2024   US  RT BREAST BX W LOC DEV 1ST LESION IMG BX SPEC US  GUIDE 02/26/2024 [facility]    SOCIAL HISTORY: Social History   Socioeconomic History   Marital status: Married    Spouse name: Not on file   Number of children: Not on file   Years of education: Not on file   Highest education level: Not on file  Occupational History   Not on file  Tobacco Use   Smoking status: Not on file   Smokeless tobacco: Not on file  Substance and Sexual Activity   Alcohol use: Not on file   Drug use: Not on file   Sexual activity: Not on file  Other Topics Concern   Not on file  Social History Narrative   Not on file   Social Drivers of Health   Financial Resource Strain: Not on file  Food Insecurity: No Food Insecurity (03/05/2024)   Hunger Vital Sign    Worried About Running Out of Food in the Last Year: Never true    Ran Out of Food in the Last Year: Never true  Transportation Needs: No Transportation Needs (03/05/2024)  PRAPARE - Administrator, Civil Service (Medical): No    Lack of Transportation (Non-Medical): No  Physical Activity: Not on file  Stress: Not on file  Social Connections: Not on file  Intimate Partner Violence: Not on file    FAMILY HISTORY: Family History  Problem Relation Age of Onset   Arthritis Mother    Asthma Mother    Varicose Veins Mother    Hypertension Father    Breast cancer Sister 67   Heart disease Sister     ALLERGIES:  is allergic to penicillins, sulfa antibiotics,  metronidazole , and morphine.  MEDICATIONS:  Current Outpatient Medications  Medication Sig Dispense Refill   nitrofurantoin  (MACRODANTIN ) 100 MG capsule 90TAKE 1 CAPSULE BY MOUTH AS DIRECTED, TAKE 1 CAPSULE BY MOUTH POSTCOITAL AS NEEDED. 90 capsule 3   omeprazole (PRILOSEC OTC) 20 MG tablet Take 20 mg by mouth daily.     No current facility-administered medications for this visit.    REVIEW OF SYSTEMS:   Constitutional: Denies fevers, chills or abnormal night sweats Breast: Palpable masses in the breast All other systems were reviewed with the patient and are negative.  PHYSICAL EXAMINATION: ECOG PERFORMANCE STATUS: 1 - Symptomatic but completely ambulatory  Vitals:   03/06/24 1248  BP: 108/78  Pulse: 81  Resp: 18  Temp: 98 F (36.7 C)  SpO2: 98%   Filed Weights   03/06/24 1248  Weight: 117 lb 9.6 oz (53.3 kg)    GENERAL:alert, no distress and comfortable    LABORATORY DATA:  I have reviewed the data as listed Lab Results  Component Value Date   WBC 6.0 03/06/2024   HGB 12.5 03/06/2024   HCT 36.6 03/06/2024   MCV 97.1 03/06/2024   PLT 256 03/06/2024   Lab Results  Component Value Date   NA 138 03/06/2024   K 3.9 03/06/2024   CL 103 03/06/2024   CO2 30 03/06/2024    RADIOGRAPHIC STUDIES: I have personally reviewed the radiological reports and agreed with the findings in the report.  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  Axillary tail mass ER 40%, PR 30%, HER2 positive, Ki-67 50%  Pathology and radiology counseling: Discussed with the patient, the details of pathology including the type of breast cancer,the clinical staging, the significance of ER,  PR and HER-2/neu receptors and the implications for treatment. After reviewing the pathology in detail, we proceeded to discuss the different treatment options between surgery, radiation, chemotherapy, antiestrogen therapies.  Recommendation based on multidisciplinary tumor board: 1. Neoadjuvant chemotherapy with TCH Perjeta 6 cycles followed by Herceptin Perjeta maintenance versus Kadcyla maintenance (based on response to neoadjuvant chemo) for 1 year 2. Followed by breast conserving surgeries with targeted lymph node dissection 3. Followed by adjuvant radiation therapy if patient had lumpectomy 4.  Antiestrogen therapy based on final pathology  Chemotherapy Counseling: I discussed the risks and benefits of chemotherapy including the risks of nausea/ vomiting, risk of infection from low WBC count, fatigue due to chemo or anemia, bruising or bleeding due to low platelets, mouth sores, loss/ change in taste and decreased appetite. Liver and kidney function will be monitored through out chemotherapy as abnormalities in liver and kidney function may be  a side effect of treatment. Cardiac dysfunction due to Herceptin and Perjeta and neuropathy risk from Taxotere were discussed in detail. Risk of permanent bone marrow dysfunction due to chemo were also discussed.  Plan: 1. Port placement 2. Echocardiogram 3. Chemotherapy class 4. Breast MRI  Return to clinic in 2 weeks to start chemotherapy.     All questions were answered. The patient knows to call the clinic with any problems, questions or concerns. I personally spent a total of 30 minutes in the care of the patient today including preparing to see the patient, getting/reviewing separately obtained history, performing a medically appropriate exam/evaluation, counseling and educating, placing orders, referring and communicating with other health care professionals, documenting clinical information in the EHR, independently interpreting results,  communicating results, and coordinating care.   Viinay K Eleftherios Dudenhoeffer, MD 03/06/24

## 2024-03-06 NOTE — Research (Signed)
 [id number], ICE COMPRESS: RANDOMIZED TRIAL OF LIMB CRYOCOMPRESSION VERSUS CONTINUOUS COMPRESSION VERSUS LOW CYCLIC COMPRESSION FOR THE PREVENTION OF TAXANE-INDUCED PERIPHERAL NEUROPATHY  Patient Dana Baxter was identified by Dr. Gudena as a potential candidate for the above listed study.  This Clinical Research Coordinator met with Dana Baxter, [id number], on 03/06/24 in a manner and location that ensures patient privacy to discuss participation in the above listed research study.  Patient is Accompanied by her husband.  A copy of the informed consent document and separate HIPAA Authorization was provided to the patient.  Patient reads, speaks, and understands [place].   Patient was provided with the business card of this Coordinator and encouraged to contact the research team with any questions.  Approximately 10 minutes were spent with the patient reviewing the informed consent documents.  Patient was provided the option of taking informed consent documents home to review and was encouraged to review at their convenience with their support network, including other care providers. Patient took the consent documents home to review. Abelardo Jock Clinical Research Coordinator [phone] 03/06/2024 3:55 PM

## 2024-03-06 NOTE — Progress Notes (Signed)
START ON PATHWAY REGIMEN - Breast     Cycle 1: A cycle is 21 days:     Pertuzumab      Trastuzumab-xxxx      Docetaxel      Carboplatin    Cycles 2 through 6: A cycle is every 21 days:     Pertuzumab      Trastuzumab-xxxx      Docetaxel      Carboplatin   **Always confirm dose/schedule in your pharmacy ordering system**  Patient Characteristics: Preoperative or Nonsurgical Candidate, M0 (Clinical Staging), Up to cT4c, Any N, M0, Neoadjuvant Therapy followed by Surgery, Invasive Disease, Chemotherapy, HER2 Positive, ER Negative Therapeutic Status: Preoperative or Nonsurgical Candidate, M0 (Clinical Staging) AJCC M Category: cM0 AJCC Grade: G3 ER Status: Negative (-) AJCC 8 Stage Grouping: IIB HER2 Status: Positive (+) AJCC T Category: cT2 AJCC N Category: cN1 PR Status: Negative (-) Breast Surgical Plan: Neoadjuvant Therapy followed by Surgery Intent of Therapy: Curative Intent, Discussed with Patient

## 2024-03-06 NOTE — Therapy (Signed)
 OUTPATIENT PHYSICAL THERAPY BREAST CANCER BASELINE EVALUATION   Patient Name: Dana Baxter MRN: [id number] DOB:1971-02-12, 53 y.o., female Today's Date: 03/06/2024  END OF SESSION:  PT End of Session - 03/06/24 1653     Visit Number 1    Number of Visits 2    Date for Recertification  09/04/24    PT Start Time 1430    PT Stop Time 1453   Also saw pt from 1550-1605 for a total of 38 min   PT Time Calculation (min) 23 min    Activity Tolerance Patient tolerated treatment well    Behavior During Therapy [facility] for tasks assessed/performed          History reviewed. No pertinent past medical history. 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   PERTINENT HISTORY:  Patient was diagnosed on 02/22/2024 with right grade 2 invasive ductal  carcinoma breast cancer. It measures 2.7 and 2.6 cm and is located in the upper outer quadrant. It is ER positive, PR negative, and HER2 positive with a Ki67 of 30%. She has a positive axillary lymph node that was biopsied.  PATIENT GOALS:   reduce lymphedema risk and learn post op HEP.   PAIN:  Are you having pain? No  PRECAUTIONS: Active CA   RED FLAGS: None   HAND DOMINANCE: right  WEIGHT BEARING RESTRICTIONS: No  FALLS:  Has patient fallen in last 6 months? 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  COGNITION: Overall cognitive status: Within functional limits for tasks assessed    POSTURE:  Forward head and rounded shoulders posture  UPPER EXTREMITY AROM/PROM:  A/PROM RIGHT   eval   Shoulder extension 48  Shoulder flexion 156  Shoulder abduction 168  Shoulder internal rotation 73  Shoulder external rotation 88    (Blank rows = not tested)  A/PROM LEFT   eval  Shoulder extension 47  Shoulder flexion 153  Shoulder abduction 173  Shoulder internal rotation 68  Shoulder external rotation 83    (Blank rows = not tested)  CERVICAL AROM: All within normal limits  UPPER EXTREMITY STRENGTH: WNL  LYMPHEDEMA ASSESSMENTS (in cm):   LANDMARK RIGHT   eval  10 cm proximal to olecranon process from proximal aspect of olecranon 22.5  Olecranon process 21.8  10 cm proximal to ulnar styloid process from proximal aspect of styloid process 20.4  Just distal to ulnar styloid process 14.8  Across hand at thumb web space 18.5  At base of 2nd digit 6  (Blank rows = not tested)  LANDMARK LEFT   eval  10 cm proximal to olecranon process from  proximal aspect of olecranon 22.9   Olecranon process 22  10 cm proximal to ulnar styloid process from proximal aspect of styloid process 18.9  Just distal to ulnar styloid process 14.4  Across hand at thumb web space 17.8  At base of 2nd digit 5.8  (Blank rows = not tested)  L-DEX LYMPHEDEMA SCREENING:  The patient was assessed using the L-Dex machine today to produce a lymphedema index baseline score. The patient will be reassessed on a regular basis (typically every 3 months) to obtain new L-Dex scores. If the score is > 6.5 points away from his/her baseline score indicating onset of subclinical lymphedema, it will be recommended to wear a compression garment for 4 weeks, 12 hours per day and then be reassessed. If the score continues to be > 6.5 points from baseline at reassessment, we will initiate lymphedema treatment. Assessing in this manner has a 95% rate of preventing clinically significant lymphedema.   L-DEX FLOWSHEETS - 03/06/24 2100       L-DEX LYMPHEDEMA SCREENING   Measurement Type Unilateral    L-DEX MEASUREMENT EXTREMITY Upper Extremity    POSITION  Standing    DOMINANT SIDE Right    At Risk Side Right    BASELINE SCORE (UNILATERAL) -4.2          QUICK DASH SURVEY:  Junie Palin - 03/06/24 0001     Open a tight or new jar No difficulty    Do heavy household chores (wash walls, wash floors) No difficulty    Carry a shopping bag or briefcase No difficulty    Wash your back No difficulty    Use a knife to cut food No difficulty    Recreational activities in which you take some force or impact through your arm, shoulder, or hand (golf, hammering, tennis) No difficulty    During the past week, to what extent has your arm, shoulder or hand problem interfered with your normal social activities with family, friends, neighbors, or groups? Not at all    During the past week,  to what extent has your arm, shoulder or hand problem limited your work or other regular daily  activities Not at all    Arm, shoulder, or hand pain. None    Tingling (pins and needles) in your arm, shoulder, or hand None    Difficulty Sleeping No difficulty    DASH Score 0 %           PATIENT EDUCATION:  Education details: Time spent educating patient on aspects of self-care to maximize post op recovery. Patient was educated on where and how to get a post op compression bra to use to reduce post op edema. Patient was also educated on the use of SOZO screenings and surveillance principles for early identification of lymphedema onset. She was instructed to use the post op pillow in the axilla for pressure and pain relief. Patient educated on lymphedema risk reduction and post op shoulder/posture HEP. Person educated: Patient Education method: Explanation, Demonstration, Handout Education comprehension: Patient verbalized understanding and returned demonstration  HOME EXERCISE PROGRAM: Patient was instructed today in a home exercise program today for post op shoulder range of motion. These included active assist shoulder flexion in sitting, scapular retraction, wall walking with shoulder abduction, and hands behind head external rotation.  She was encouraged to do these twice a day, holding 3 seconds and repeating 5 times when permitted by her physician.   ASSESSMENT:  CLINICAL IMPRESSION: Patient was diagnosed on 02/22/2024 with right grade 2 invasive ductal carcinoma breast cancer. It measures 2.7 and 2.6 cm and is located in the upper outer quadrant. It is ER positive, PR negative, and HER2 positive with a Ki67 of 30%. She has a positive axillary lymph node that was biopsied. Her multidisciplinary medical team met prior to her assessments to determine a recommended treatment plan. She is planning to have neoadjuvant chemotherapy followed by bilateral nipple sparing mastectomies with reconstruction and a targeted axillary lymph node dissection, possible radiation, and possible  anti-estrogen therapy depending on repeat prognostics.. She will benefit from a post op PT reassessment to determine needs and from L-Dex screens every 3 months for 2 years to detect subclinical lymphedema.  Pt will benefit from skilled therapeutic intervention to improve on the following deficits: Decreased knowledge of precautions, impaired UE functional use, pain, decreased ROM, postural dysfunction.   PT treatment/interventions: ADL/self-care home management, pt/family education, therapeutic exercise  REHAB POTENTIAL: Excellent  CLINICAL DECISION MAKING: Stable/uncomplicated  EVALUATION COMPLEXITY: Low   GOALS: Goals reviewed with patient? YES  LONG TERM GOALS: (STG=LTG)    Name Target Date Goal status  1 Pt will be able to verbalize understanding of pertinent lymphedema risk reduction practices relevant to her dx specifically related to skin care.  Baseline:  No knowledge 03/06/2024 Achieved at eval  2 Pt will be able to return demo and/or verbalize understanding of the post op HEP related to regaining shoulder ROM. Baseline:  No knowledge 03/06/2024 Achieved at eval  3 Pt will be able to verbalize understanding of the importance of viewing the post op After Breast CA Class video for further lymphedema risk reduction education and therapeutic exercise.  Baseline:  No knowledge 03/06/2024 Achieved at eval  4 Pt will demo she has regained full shoulder ROM and function post operatively compared to baselines.  Baseline: See objective measurements taken today. 09/04/2024     PLAN:  PT FREQUENCY/DURATION: EVAL and 1 follow up appointment.   PLAN FOR NEXT SESSION: will reassess 3-4 weeks post op to determine needs.   Patient  will follow up at outpatient cancer rehab 3-4 weeks following surgery.  If the patient requires physical therapy at that time, a specific plan will be dictated and sent to the referring physician for approval. The patient was educated today on appropriate basic  range of motion exercises to begin post operatively and the importance of viewing the After Breast Cancer class video following surgery.  Patient was educated today on lymphedema risk reduction practices as it pertains to recommendations that will benefit the patient immediately following surgery.  She verbalized good understanding.    Physical Therapy Information for After Breast Cancer Surgery/Treatment:  Lymphedema is a swelling condition that you may be at risk for in your arm if you have lymph nodes removed from the armpit area.  After a sentinel node biopsy, the risk is approximately 5-9% and is higher after an axillary node dissection.  There is treatment available for this condition and it is not life-threatening.  Contact your physician or physical therapist with concerns. You may begin the 4 shoulder/posture exercises (see additional sheet) when permitted by your physician (typically a week after surgery).  If you have drains, you may need to wait until those are removed before beginning range of motion exercises.  A general recommendation is to not lift your arms above shoulder height until drains are removed.  These exercises should be done to your tolerance and gently.  This is not a no pain/no gain type of recovery so listen to your body and stretch into the range of motion that you can tolerate, stopping if you have pain.  If you are having immediate reconstruction, ask your plastic surgeon about doing exercises as he or she may want you to wait. We encourage you to view the After Breast Cancer class video following surgery.  You will learn information related to lymphedema risk, prevention and treatment and additional exercises to regain mobility following surgery.   While undergoing any medical procedure or treatment, try to avoid blood pressure being taken or needle sticks from occurring on the arm on the side of cancer.   This recommendation begins after surgery and continues for the rest  of your life.  This may help reduce your risk of getting lymphedema (swelling in your arm). An excellent resource for those seeking information on lymphedema is the National Lymphedema Network's web site. It can be accessed at www.lymphnet.org If you notice swelling in your hand, arm or breast at any time following surgery (even if it is many years from now), please contact your doctor or physical therapist to discuss this.  Lymphedema can be treated at any time but it is easier for you if it is treated early on.  If you feel like your shoulder motion is not returning to normal in a reasonable amount of time, please contact your surgeon or physical therapist.  [facility] [phone]. [address], [place] [address]  [facility] CLASS After Breast Cancer Class  After Breast Cancer Class is a specially designed exercise class video to assist you in a safe recover after having breast cancer surgery.  In this video you will learn how to get back to full function whether your drains were just removed or if you had surgery a month ago. The video can be viewed on this page: [email or web address].boyd-meyer.org/ or on YouTube here: https://youtu.[email or web address].  Class Goals  Understand specific stretches to improve the flexibility of you chest and shoulder. Learn ways to safely strengthen your upper body  and improve your posture. Understand the warning signs of infection and why you may be at risk for an arm infection. Learn about Lymphedema and prevention.  ** You do not need to view this video until after surgery.  Drains should be removed to participate in the recommended exercises on the video.  Patient was instructed today in a home exercise program today for post op shoulder range of motion. These included active assist shoulder flexion in sitting, scapular retraction, wall walking with shoulder abduction,  and hands behind head external rotation.  She was encouraged to do these twice a day, holding 3 seconds and repeating 5 times when permitted by her physician.  Eward Wonda Sharps, [place] 03/06/24 10:02 PM

## 2024-03-06 NOTE — Progress Notes (Signed)
 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 female who is presenting to the office today for evaluation of her newly diagnosed right breast cancer. She is accompanied by her husband. She is doing well overall.   She presented to medical attention earlier this month 2 palpable abnormalities in the right breast. She accordingly presented for a right breast diagnostic mammography with tomography and right breast ultrasonography at [facility] on 02/22/24 showing: a suspicious 2.7 cm mass in the 12 o'clock right breast located 4 cmfn correlating to one of the palpable abnormalities; a suspicious 2.6 cm mass in the lower right axillary correlating to the second palpable abnormality; a single lower right right axillary lymph node measuring 1.2 cm; a 6 mm indeterminate mass in the 11 o'clock right breast located 4 cm; and a likely benign but indeterminate are of microcalcifications over the upper outer quadrant of the right breast.   Given these extensive findings, she also presented for a left breast diagnostic mammogram and left breast ultrasound on 02/26/24 which showed no  evidence of malignancy, and an incidental 8 mm cyst in the 12 o'clock region of the left breast.   Biopsies collected on 02/26/24 are detailed as follows:  -- Biopsy of the 12 o'clock right breast 4 (cmfn) showed: grade 2 invasive ductal carcinoma measuring 9 mm in the greatest linear extent of the sample with intermediate grade DCIS and LVI present. Prognostic indicators significant for: estrogen receptor, 5% positive with strong staining intensity and progesterone receptor, 0% negative. Proliferation marker Ki67 at 30%. HER2 positive. -- Biopsy of the right axillary tail mass showed grade 1 invasive ductal carcinoma measuring 1.3 cm in the greatest linear extent of the sample with LVI present. Prognostic indicators significant for: estrogen receptor 40% positive and progesterone receptor 30% positive, both with strong staining intensity; Proliferation marker Ki67 at 50%; Her2 status positive; Grade 1.  -- Biopsy of the abnormal right axillary lymph node showed metastatic carcinoma.   Menarche: 53 years old Age at first live birth: 53 years old GP: 2 LMP: 2004 Contraceptive: yes (did not indicate type) HRT: never used    The patient was referred today for presentation in the multidisciplinary conference.  Radiology studies and pathology slides were presented there for review and discussion of treatment options.  A consensus was discussed regarding potential next steps.  PREVIOUS RADIATION THERAPY: No  PAST MEDICAL HISTORY: No past medical history on file.  PAST SURGICAL HISTORY: Past Surgical History:  Procedure Laterality Date   BREAST BIOPSY Right 02/26/2024   US  RT BREAST BX W LOC DEV EA ADD LESION IMG BX SPEC US  GUIDE 02/26/2024 [facility] MAMMOGRAP[facility]  BREAST BIOPSY Right 02/26/2024   US  RT BREAST BX W LOC DEV 1ST LESION IMG BX SPEC US  GUIDE 02/26/2024 [facility] MAMMOGR[facility]    FAMILY HISTORY:  Family History  Problem Relation Age of Onset   Arthritis Mother    Asthma Mother    Varicose  Veins Mother    Hypertension Father    Breast cancer Sister 60   Heart disease Sister     SOCIAL HISTORY:  Social History   Socioeconomic History   Marital status: Married    Spouse name: Not on file   Number of children: Not on file   Years of education: Not on file   Highest education level: Not on file  Occupational History   Not on file  Tobacco Use   Smoking status: Not on file   Smokeless tobacco: Not on file  Substance and Sexual Activity   Alcohol use: Not on file   Drug use: Not on file   Sexual activity: Not on file  Other Topics Concern   Not on file  Social History Narrative   Not on file   Social Drivers of Health   Financial Resource Strain: Not on file  Food Insecurity: No Food Insecurity (03/05/2024)   Hunger Vital Sign    Worried About Running Out of Food in the Last Year: Never true    Ran Out of Food in the Last Year: Never true  Transportation Needs: No Transportation Needs (03/05/2024)   PRAPARE - Administrator, Civil Service (Medical): No    Lack of Transportation (Non-Medical): No  Physical Activity: Not on file  Stress: Not on file  Social Connections: Not on file    ALLERGIES:  Allergies  Allergen Reactions   Penicillins Anaphylaxis   Sulfa Antibiotics Anaphylaxis   Metronidazole  Nausea Only   Morphine Rash    MEDICATIONS:  Current Outpatient Medications  Medication Sig Dispense Refill   dexamethasone (DECADRON) 4 MG tablet Take 1 tablet day before chemo and 1 tablet day after chemo with food 30 tablet 1   lidocaine-prilocaine (EMLA) cream Apply to affected area once 30 g 3   nitrofurantoin  (MACRODANTIN ) 100 MG capsule 90TAKE 1 CAPSULE BY MOUTH AS DIRECTED, TAKE 1 CAPSULE BY MOUTH POSTCOITAL AS NEEDED. 90 capsule 3   omeprazole (PRILOSEC OTC) 20 MG tablet Take 20 mg by mouth daily.     ondansetron (ZOFRAN) 8 MG tablet Take 1 tablet (8 mg total) by mouth every 8 (eight) hours as needed for nausea or vomiting. Start on  the third day after chemotherapy. 30 tablet 1   prochlorperazine (COMPAZINE) 10 MG tablet Take 1 tablet (10 mg total) by mouth every 6 (six) hours as needed for nausea or vomiting. 30 tablet 1   No current facility-administered medications for this encounter.    REVIEW OF SYSTEMS: A 10+ POINT REVIEW OF SYSTEMS WAS OBTAINED including neurology, dermatology, psychiatry, cardiac, respiratory, lymph, extremities, GI, GU, musculoskeletal, constitutional, reproductive, HEENT. On the provided form, she reports a weight loss of approximately 10 pounds, wearing glasses, heartburn, history of UTI and kidney infections, a breast lump, anxiety, and hot glasses. She denies any other symptoms.    PHYSICAL EXAM:    03/06/2024  Vitals with BMI   Weight 117 lbs 10 oz   Systolic 108   Diastolic 78   Pulse 81    Lungs are clear to auscultation bilaterally. Heart has regular rate and rhythm. No palpable cervical, supraclavicular, or axillary adenopathy. Abdomen soft, non-tender, normal bowel  sounds. Breast: Left breast with no palpable mass, nipple discharge, or bleeding. Right breast / axillary region with palpable node in the axilla measuring 1.5 cm. There is also a 3 cm palpable mass in the upper central right breast.    KPS = 90  100 - Normal; no complaints; no evidence of disease. 90   - Able to carry on normal activity; minor signs or symptoms of disease. 80   - Normal activity with effort; some signs or symptoms of disease. 9   - Cares for self; unable to carry on normal activity or to do active work. 60   - Requires occasional assistance, but is able to care for most of his personal needs. 50   - Requires considerable assistance and frequent medical care. 40   - Disabled; requires special care and assistance. 30   - Severely disabled; hospital admission is indicated although death not imminent. 20   - Very sick; hospital admission necessary; active supportive treatment necessary. 10   - Moribund;  fatal processes progressing rapidly. 0     - Dead  Karnofsky DA, Abelmann WH, Craver LS and Burchenal [facility] [phone]) The use of the nitrogen mustards in the palliative treatment of carcinoma: with particular reference to bronchogenic carcinoma Cancer 1 [id number]  LABORATORY DATA:  Lab Results  Component Value Date   WBC 6.0 03/06/2024   HGB 12.5 03/06/2024   HCT 36.6 03/06/2024   MCV 97.1 03/06/2024   PLT 256 03/06/2024   Lab Results  Component Value Date   NA 138 03/06/2024   K 3.9 03/06/2024   CL 103 03/06/2024   CO2 30 03/06/2024   Lab Results  Component Value Date   ALT 21 03/06/2024   AST 16 03/06/2024   ALKPHOS 56 03/06/2024   BILITOT 0.4 03/06/2024    PULMONARY FUNCTION TEST:   Review Flowsheet        No data to display          RADIOGRAPHY: US  RT BREAST BX W LOC DEV EA ADD LESION IMG BX SPEC US  GUIDE Addendum Date: 02/28/2024 ADDENDUM REPORT: 02/28/2024 09:32 ADDENDUM: PATHOLOGY revealed: Site 1. Breast, right, needle core biopsy, 12 o'clock 4cmfn (coil clip)- INVASIVE DUCTAL CARCINOMA, DUCTAL CARCINOMA IN SITU, SOLID, INTERMEDIATE NUCLEAR GRADE, WITHOUT NECROSIS. OVERALL GRADE: 2. LYMPHOVASCULAR INVASION: PRESENT. CANCER LENGTH: 9 MM. CALCIFICATIONS: PRESENT IN DCIS Pathology results are CONCORDANT with imaging findings, per Dr. Toribio Agreste. PATHOLOGY revealed: Site 2. Breast, right, needle core biopsy, Axillary tail (heart clip)- INVASIVE DUCTAL CARCINOMA, OVERALL GRADE: 1. LYMPHOVASCULAR INVASION: PRESENT. CANCER LENGTH: 13 MM. CALCIFICATIONS: NOT IDENTIFIED. Pathology results are CONCORDANT with imaging findings, per Dr. Toribio Agreste. PATHOLOGY revealed: Site 3. Lymph node, needle/core biopsy, Right axilla (hydromark)- LYMPH NODE WITH METASTATIC CARCINOMA. Pathology results are CONCORDANT with imaging findings, per Dr. Toribio Agreste. Pathology results and recommendations below were discussed with patient by telephone on 02/27/2024 by Mliss Molt RN. Patient reported biopsy  site within normal limits with slight tenderness at the site. Post biopsy care instructions were reviewed, questions were answered and my direct phone number was provided to patient. Patient was instructed to call [facility] if any concerns or questions arise related to the biopsy. RECOMMENDATIONS: 1. Surgical and oncological consultation. The patient was referred to [facility] on March 06, 2024. Note: Additional biopsies will be needed if patient is considering breast conservation; patient has expressed that she does not want further  biopsies and wants to have a mastectomy. Pathology results reported by Mliss Molt RN 02/27/2024. Electronically Signed   By: Toribio Agreste M.D.   On: 02/28/2024 09:32   Result Date: 02/28/2024 CLINICAL DATA:  Patient presents for ultrasound-guided core needle biopsy of a 2.7 cm suspicious mass over the 12 o'clock position of the right breast 4 cm from the nipple, suspicious 2.6 cm mass over the lower right axilla as well as a 1.2 cm abnormal right axillary lymph node. EXAM: ULTRASOUND GUIDED RIGHT BREAST CORE NEEDLE BIOPSY x 3 SITES. COMPARISON:  Previous exam(s). PROCEDURE: While taking pre biopsy images, it was noted that there are 3 abnormal subpectoral masses/lymph nodes measuring 1 x 1.9 x 2.2 cm, 1 x 1.4 x 1.4 cm and 1 x 1.1 x 1.5 cm respectively. I met with the patient and we discussed the procedure of ultrasound-guided biopsy, including benefits and alternatives. We discussed the high likelihood of a successful procedure. We discussed the risks of the procedure, including infection, bleeding, tissue injury, clip migration, and inadequate sampling. Informed written consent was given. The usual time-out protocol was performed immediately prior to the procedure. A. Lesion quadrant: Right upper outer quadrant (11:30-12 o'clock position). Using sterile technique and 1% Lidocaine  as local anesthetic, under direct ultrasound visualization, a 12 gauge spring-loaded device was used to perform biopsy of the targeted 2.7 cm mass over the 12 o'clock position of the right breast using a inferior to superior approach. At the conclusion of the procedure a coil shaped tissue marker clip was deployed into the biopsy cavity. Follow up 2 view mammogram was performed and dictated separately. B. Lesion quadrant: Right upper outer quadrant (axillary tail/low axilla). Using sterile technique and 1% Lidocaine as local anesthetic, under direct ultrasound visualization, a 12 gauge spring-loaded device was used to perform biopsy of the targeted 2.6 cm mass over the lower right axilla using a inferior to superior approach. At the conclusion of the procedure a heart shaped tissue marker clip was deployed into the biopsy cavity. Follow up 2 view mammogram was performed and dictated separately. C. Lesion quadrant: Right axillary. Using sterile technique and 1% Lidocaine as local anesthetic, under direct ultrasound visualization, a 14 gauge spring-loaded device was used to perform biopsy of the abnormal right axillary lymph node using a medial to lateral approach. At the conclusion of the procedure a HydroMARK shaped tissue marker clip was deployed into the biopsy cavity. Follow up 2 view mammogram was performed and dictated separately. IMPRESSION: 1. Ultrasound guided biopsy of 3 sites including suspicious masses over the 12 o'clock position of the right breast, lower right axilla/axillary tail of the right breast as well as abnormal right axillary lymph node. No apparent complications. 2. Noted on the pre scanning images of the axilla it was noted that there are 3 abnormal subpectoral lymph nodes/masses as described above. Electronically Signed: By: Toribio Agreste M.D. On: 02/26/2024 14:24   US  RT BREAST BX W LOC DEV 1ST LESION IMG BX SPEC US  GUIDE Addendum Date: 02/28/2024 ADDENDUM REPORT: 02/28/2024 09:32  ADDENDUM: PATHOLOGY revealed: Site 1. Breast, right, needle core biopsy, 12 o'clock 4cmfn (coil clip)- INVASIVE DUCTAL CARCINOMA, DUCTAL CARCINOMA IN SITU, SOLID, INTERMEDIATE NUCLEAR GRADE, WITHOUT NECROSIS. OVERALL GRADE: 2. LYMPHOVASCULAR INVASION: PRESENT. CANCER LENGTH: 9 MM. CALCIFICATIONS: PRESENT IN DCIS Pathology results are CONCORDANT with imaging findings, per Dr. Toribio Agreste. PATHOLOGY revealed: Site 2. Breast, right, needle core biopsy, Axillary tail (heart clip)- INVASIVE DUCTAL CARCINOMA, OVERALL GRADE: 1. LYMPHOVASCULAR INVASION: PRESENT. CANCER LENGTH:  13 MM. CALCIFICATIONS: NOT IDENTIFIED. Pathology results are CONCORDANT with imaging findings, per Dr. Toribio Agreste. PATHOLOGY revealed: Site 3. Lymph node, needle/core biopsy, Right axilla (hydromark)- LYMPH NODE WITH METASTATIC CARCINOMA. Pathology results are CONCORDANT with imaging findings, per Dr. Toribio Agreste. Pathology results and recommendations below were discussed with patient by telephone on 02/27/2024 by Mliss Molt RN. Patient reported biopsy site within normal limits with slight tenderness at the site. Post biopsy care instructions were reviewed, questions were answered and my direct phone number was provided to patient. Patient was instructed to call [facility] if any concerns or questions arise related to the biopsy. RECOMMENDATIONS: 1. Surgical and oncological consultation. The patient was referred to [facility] on March 06, 2024. Note: Additional biopsies will be needed if patient is considering breast conservation; patient has expressed that she does not want further biopsies and wants to have a mastectomy. Pathology results reported by Mliss Molt RN 02/27/2024. Electronically Signed   By: Toribio Agreste M.D.   On: 02/28/2024 09:32   Result Date: 02/28/2024 CLINICAL DATA:  Patient presents for ultrasound-guided core needle  biopsy of a 2.7 cm suspicious mass over the 12 o'clock position of the right breast 4 cm from the nipple, suspicious 2.6 cm mass over the lower right axilla as well as a 1.2 cm abnormal right axillary lymph node. EXAM: ULTRASOUND GUIDED RIGHT BREAST CORE NEEDLE BIOPSY x 3 SITES. COMPARISON:  Previous exam(s). PROCEDURE: While taking pre biopsy images, it was noted that there are 3 abnormal subpectoral masses/lymph nodes measuring 1 x 1.9 x 2.2 cm, 1 x 1.4 x 1.4 cm and 1 x 1.1 x 1.5 cm respectively. I met with the patient and we discussed the procedure of ultrasound-guided biopsy, including benefits and alternatives. We discussed the high likelihood of a successful procedure. We discussed the risks of the procedure, including infection, bleeding, tissue injury, clip migration, and inadequate sampling. Informed written consent was given. The usual time-out protocol was performed immediately prior to the procedure. A. Lesion quadrant: Right upper outer quadrant (11:30-12 o'clock position). Using sterile technique and 1% Lidocaine as local anesthetic, under direct ultrasound visualization, a 12 gauge spring-loaded device was used to perform biopsy of the targeted 2.7 cm mass over the 12 o'clock position of the right breast using a inferior to superior approach. At the conclusion of the procedure a coil shaped tissue marker clip was deployed into the biopsy cavity. Follow up 2 view mammogram was performed and dictated separately. B. Lesion quadrant: Right upper outer quadrant (axillary tail/low axilla). Using sterile technique and 1% Lidocaine as local anesthetic, under direct ultrasound visualization, a 12 gauge spring-loaded device was used to perform biopsy of the targeted 2.6 cm mass over the lower right axilla using a inferior to superior approach. At the conclusion of the procedure a heart shaped tissue marker clip was deployed into the biopsy cavity. Follow up 2 view mammogram was performed and dictated  separately. C. Lesion quadrant: Right axillary. Using sterile technique and 1% Lidocaine as local anesthetic, under direct ultrasound visualization, a 14 gauge spring-loaded device was used to perform biopsy of the abnormal right axillary lymph node using a medial to lateral approach. At the conclusion of the procedure a HydroMARK shaped tissue marker clip was deployed into the biopsy cavity. Follow up 2 view mammogram was performed and dictated separately. IMPRESSION: 1. Ultrasound guided biopsy of 3 sites including suspicious masses over the 12  o'clock position of the right breast, lower right axilla/axillary tail of the right breast as well as abnormal right axillary lymph node. No apparent complications. 2. Noted on the pre scanning images of the axilla it was noted that there are 3 abnormal subpectoral lymph nodes/masses as described above. Electronically Signed: By: Toribio Agreste M.D. On: 02/26/2024 14:24   US  AXILLARY NODE CORE BIOPSY RIGHT Addendum Date: 02/28/2024 ADDENDUM REPORT: 02/28/2024 09:32 ADDENDUM: PATHOLOGY revealed: Site 1. Breast, right, needle core biopsy, 12 o'clock 4cmfn (coil clip)- INVASIVE DUCTAL CARCINOMA, DUCTAL CARCINOMA IN SITU, SOLID, INTERMEDIATE NUCLEAR GRADE, WITHOUT NECROSIS. OVERALL GRADE: 2. LYMPHOVASCULAR INVASION: PRESENT. CANCER LENGTH: 9 MM. CALCIFICATIONS: PRESENT IN DCIS Pathology results are CONCORDANT with imaging findings, per Dr. Toribio Agreste. PATHOLOGY revealed: Site 2. Breast, right, needle core biopsy, Axillary tail (heart clip)- INVASIVE DUCTAL CARCINOMA, OVERALL GRADE: 1. LYMPHOVASCULAR INVASION: PRESENT. CANCER LENGTH: 13 MM. CALCIFICATIONS: NOT IDENTIFIED. Pathology results are CONCORDANT with imaging findings, per Dr. Toribio Agreste. PATHOLOGY revealed: Site 3. Lymph node, needle/core biopsy, Right axilla (hydromark)- LYMPH NODE WITH METASTATIC CARCINOMA. Pathology results are CONCORDANT with imaging findings, per Dr. Toribio Agreste. Pathology results and  recommendations below were discussed with patient by telephone on 02/27/2024 by Mliss Molt RN. Patient reported biopsy site within normal limits with slight tenderness at the site. Post biopsy care instructions were reviewed, questions were answered and my direct phone number was provided to patient. Patient was instructed to call [facility] if any concerns or questions arise related to the biopsy. RECOMMENDATIONS: 1. Surgical and oncological consultation. The patient was referred to [facility] on March 06, 2024. Note: Additional biopsies will be needed if patient is considering breast conservation; patient has expressed that she does not want further biopsies and wants to have a mastectomy. Pathology results reported by Mliss Molt RN 02/27/2024. Electronically Signed   By: Toribio Agreste M.D.   On: 02/28/2024 09:32   Result Date: 02/28/2024 CLINICAL DATA:  Patient presents for ultrasound-guided core needle biopsy of a 2.7 cm suspicious mass over the 12 o'clock position of the right breast 4 cm from the nipple, suspicious 2.6 cm mass over the lower right axilla as well as a 1.2 cm abnormal right axillary lymph node. EXAM: ULTRASOUND GUIDED RIGHT BREAST CORE NEEDLE BIOPSY x 3 SITES. COMPARISON:  Previous exam(s). PROCEDURE: While taking pre biopsy images, it was noted that there are 3 abnormal subpectoral masses/lymph nodes measuring 1 x 1.9 x 2.2 cm, 1 x 1.4 x 1.4 cm and 1 x 1.1 x 1.5 cm respectively. I met with the patient and we discussed the procedure of ultrasound-guided biopsy, including benefits and alternatives. We discussed the high likelihood of a successful procedure. We discussed the risks of the procedure, including infection, bleeding, tissue injury, clip migration, and inadequate sampling. Informed written consent was given. The usual time-out protocol was performed immediately prior to the  procedure. A. Lesion quadrant: Right upper outer quadrant (11:30-12 o'clock position). Using sterile technique and 1% Lidocaine as local anesthetic, under direct ultrasound visualization, a 12 gauge spring-loaded device was used to perform biopsy of the targeted 2.7 cm mass over the 12 o'clock position of the right breast using a inferior to superior approach. At the conclusion of the procedure a coil shaped tissue marker clip was deployed into the biopsy cavity. Follow up 2 view mammogram was performed and dictated separately. B. Lesion quadrant: Right upper outer quadrant (axillary tail/low axilla).  Using sterile technique and 1% Lidocaine as local anesthetic, under direct ultrasound visualization, a 12 gauge spring-loaded device was used to perform biopsy of the targeted 2.6 cm mass over the lower right axilla using a inferior to superior approach. At the conclusion of the procedure a heart shaped tissue marker clip was deployed into the biopsy cavity. Follow up 2 view mammogram was performed and dictated separately. C. Lesion quadrant: Right axillary. Using sterile technique and 1% Lidocaine as local anesthetic, under direct ultrasound visualization, a 14 gauge spring-loaded device was used to perform biopsy of the abnormal right axillary lymph node using a medial to lateral approach. At the conclusion of the procedure a HydroMARK shaped tissue marker clip was deployed into the biopsy cavity. Follow up 2 view mammogram was performed and dictated separately. IMPRESSION: 1. Ultrasound guided biopsy of 3 sites including suspicious masses over the 12 o'clock position of the right breast, lower right axilla/axillary tail of the right breast as well as abnormal right axillary lymph node. No apparent complications. 2. Noted on the pre scanning images of the axilla it was noted that there are 3 abnormal subpectoral lymph nodes/masses as described above. Electronically Signed: By: Toribio Agreste M.D. On: 02/26/2024 14:24    MM 3D DIAGNOSTIC MAMMOGRAM UNILATERAL LEFT BREAST Result Date: 02/26/2024 CLINICAL DATA:  Patient presents for left breast evaluation due to recent suspicious findings and 3 site biopsy of the right breast. EXAM: DIGITAL DIAGNOSTIC UNILATERAL LEFT MAMMOGRAM WITH TOMOSYNTHESIS AND CAD; ULTRASOUND LEFT BREAST LIMITED TECHNIQUE: Left digital diagnostic mammography and breast tomosynthesis was performed. The images were evaluated with computer-aided detection. ; Targeted ultrasound examination of the left breast was performed. COMPARISON:  Previous exam(s). ACR Breast Density Category c: The breasts are heterogeneously dense, which may obscure small masses. FINDINGS: Examination demonstrates possible subtle distortion over the central left breast on the CC image which does not persist on additional spot compression and true lateral images. This is not seen on the full field MLO view. This is likely due to overlapping tissue. Remainder of the left breast to include scattered round microcalcifications is unchanged. Targeted ultrasound is performed, showing no focal abnormality over the upper central left breast to correlate to mammographic distortion. There is an incidental 8 mm cyst over the 12 o'clock position of the left breast 3 cm from the nipple. Ultrasound the left axilla is normal. IMPRESSION: Stable left breast mammogram. Incidental 8 mm cyst over the 12 o'clock position of the left breast. RECOMMENDATION: Recommend annual screening mammographic follow-up of the left breast. I have discussed the findings and recommendations with the patient. If applicable, a reminder letter will be sent to the patient regarding the next appointment. BI-RADS CATEGORY  2: Benign. Electronically Signed   By: Toribio Agreste M.D.   On: 02/26/2024 15:56   US  LIMITED ULTRASOUND INCLUDING AXILLA LEFT BREAST  Result Date: 02/26/2024 CLINICAL DATA:  Patient presents for left breast evaluation due to recent suspicious findings and 3  site biopsy of the right breast. EXAM: DIGITAL DIAGNOSTIC UNILATERAL LEFT MAMMOGRAM WITH TOMOSYNTHESIS AND CAD; ULTRASOUND LEFT BREAST LIMITED TECHNIQUE: Left digital diagnostic mammography and breast tomosynthesis was performed. The images were evaluated with computer-aided detection. ; Targeted ultrasound examination of the left breast was performed. COMPARISON:  Previous exam(s). ACR Breast Density Category c: The breasts are heterogeneously dense, which may obscure small masses. FINDINGS: Examination demonstrates possible subtle distortion over the central left breast on the CC image which does not persist on additional spot compression and true  lateral images. This is not seen on the full field MLO view. This is likely due to overlapping tissue. Remainder of the left breast to include scattered round microcalcifications is unchanged. Targeted ultrasound is performed, showing no focal abnormality over the upper central left breast to correlate to mammographic distortion. There is an incidental 8 mm cyst over the 12 o'clock position of the left breast 3 cm from the nipple. Ultrasound the left axilla is normal. IMPRESSION: Stable left breast mammogram. Incidental 8 mm cyst over the 12 o'clock position of the left breast. RECOMMENDATION: Recommend annual screening mammographic follow-up of the left breast. I have discussed the findings and recommendations with the patient. If applicable, a reminder letter will be sent to the patient regarding the next appointment. BI-RADS CATEGORY  2: Benign. Electronically Signed   By: Toribio Agreste M.D.   On: 02/26/2024 15:56   MM CLIP PLACEMENT RIGHT Result Date: 02/26/2024 CLINICAL DATA:  Patient is post ultrasound-guided core needle biopsy of 3 sites including a suspicious mass over the 12 o'clock position of the right breast, suspicious mass over the deep axillary tail portion of the right breast as well as abnormal lower right axillary lymph node. EXAM: 3D DIAGNOSTIC  RIGHT MAMMOGRAM POST ULTRASOUND BIOPSY COMPARISON:  Previous exam(s). ACR Breast Density Category c: The breasts are heterogeneously dense, which may obscure small masses. FINDINGS: 3D Mammographic images were obtained following ultrasound guided biopsy of the targeted masses over the 12 o'clock position of the right breast, deep axillary tail portion of the right breast as well as right axilla. The biopsy marking clips are at the 3 expected sites of biopsy. IMPRESSION: Appropriate positioning of the coil shaped biopsy marking clip at the site of biopsy in the 12 o'clock position of the right breast. Appropriate positioning of the heart shaped biopsy marking clip at the site of biopsy in the deep axillary tail portion/lower axilla right breast. Appropriate positioning of the HydroMARK shaped biopsy marking clip at the site of biopsy in the right axillary lymph node. Final Assessment: Post Procedure Mammograms for Marker Placement Electronically Signed   By: Toribio Agreste M.D.   On: 02/26/2024 15:08   MM 3D DIAGNOSTIC MAMMOGRAM UNILATERAL RIGHT BREAST Result Date: 02/22/2024 CLINICAL DATA:  Patient presents for a diagnostic right breast examination due to 2 new palpable abnormalities. EXAM: DIGITAL DIAGNOSTIC UNILATERAL RIGHT MAMMOGRAM WITH TOMOSYNTHESIS AND CAD; ULTRASOUND RIGHT BREAST LIMITED TECHNIQUE: Right digital diagnostic mammography and breast tomosynthesis was performed. The images were evaluated with computer-aided detection. ; Targeted ultrasound examination of the right breast was performed COMPARISON:  Previous exam(s). ACR Breast Density Category c: The breasts are heterogeneously dense, which may obscure small masses. FINDINGS: Examination demonstrates a moderate-sized spiculated mass over the upper central right breast in the middle third accounting for 1 of patient's palpable abnormalities. There is no focal abnormality seen over the lower right axilla to account for patient's second palpable  abnormality. There are scattered and grouped round microcalcifications throughout the upper outer quadrant. These may be slightly more prominent in the outer breast although not significantly changed allowing for differences in technique compared to 2022. Targeted ultrasound is performed, showing a hypoechoic mass with irregular shape and margins over the 12 o'clock position of the right breast 4 cm from the nipple measuring 2 x 2 x 2.7 cm correlating to patient's palpable and mammographic finding. 5 mm away from this mass is a small indeterminate oval hypoechoic mass at the 11 o'clock position 4 cm from the nipple  measuring 4 x 6 x 6 mm. Ultrasound over the lower right axilla in the area patient's second palpable abnormality demonstrates a hypoechoic mass with irregular shape and margins and mild internal heterogeneity measuring 1.3 x 2.2 x 2.6 cm. There is a single abnormal lower right axillary lymph node measuring 0.9 x 1 x 1.2 cm immediately adjacent to the large palpable lower axillary mass as described above. Remainder of the axilla is unremarkable. IMPRESSION: 1. Suspicious 2.7 cm mass over the 12 o'clock position of the right breast 4 cm from the nipple correlating to 1 of patient's palpable abnormalities. 2. 2.6 cm suspicious mass over the lower right axilla correlating to patient's second palpable abnormality. 3. Single abnormal lower right axillary lymph node measuring 1.2 cm. 4. 6 mm indeterminate mass at the 11 o'clock position of the right breast 4 cm from the nipple. 5. Probable benign, but indeterminate microcalcifications over the upper outer quadrant of the right breast. RECOMMENDATION: 1. Recommend 3 site biopsy including ultrasound-guided core needle biopsies of the suspicious masses over the 12 o'clock position and lower right axilla as well as the 1.2 cm abnormal lower right axillary lymph node. Pending biopsy results and treatment plan, patient may need additional ultrasound core biopsy of the  6 mm mass at the 11 o'clock position as well as stereotactic biopsy of the right breast microcalcifications. 2. Also recommend mammographic imaging of the left breast in light of the above findings. I have discussed the findings and recommendations with the patient. If applicable, a reminder letter will be sent to the patient regarding the next appointment. BI-RADS CATEGORY  5: Highly suggestive of malignancy. Biopsy scheduling will be facilitated by the ultrasound technologist prior to patient's departure. Electronically Signed   By: Toribio Agreste M.D.   On: 02/22/2024 15:20   US  LIMITED ULTRASOUND INCLUDING AXILLA RIGHT BREAST Result Date: 02/22/2024 CLINICAL DATA:  Patient presents for a diagnostic right breast examination due to 2 new palpable abnormalities. EXAM: DIGITAL DIAGNOSTIC UNILATERAL RIGHT MAMMOGRAM WITH TOMOSYNTHESIS AND CAD; ULTRASOUND RIGHT BREAST LIMITED TECHNIQUE: Right digital diagnostic mammography and breast tomosynthesis was performed. The images were evaluated with computer-aided detection. ; Targeted ultrasound examination of the right breast was performed COMPARISON:  Previous exam(s). ACR Breast Density Category c: The breasts are heterogeneously dense, which may obscure small masses. FINDINGS: Examination demonstrates a moderate-sized spiculated mass over the upper central right breast in the middle third accounting for 1 of patient's palpable abnormalities. There is no focal abnormality seen over the lower right axilla to account for patient's second palpable abnormality. There are scattered and grouped round microcalcifications throughout the upper outer quadrant. These may be slightly more prominent in the outer breast although not significantly changed allowing for differences in technique compared to 2022. Targeted ultrasound is performed, showing a hypoechoic mass with irregular shape and margins over the 12 o'clock position of the right breast 4 cm from the nipple measuring 2 x  2 x 2.7 cm correlating to patient's palpable and mammographic finding. 5 mm away from this mass is a small indeterminate oval hypoechoic mass at the 11 o'clock position 4 cm from the nipple measuring 4 x 6 x 6 mm. Ultrasound over the lower right axilla in the area patient's second palpable abnormality demonstrates a hypoechoic mass with irregular shape and margins and mild internal heterogeneity measuring 1.3 x 2.2 x 2.6 cm. There is a single abnormal lower right axillary lymph node measuring 0.9 x 1 x 1.2 cm immediately adjacent to the large  palpable lower axillary mass as described above. Remainder of the axilla is unremarkable. IMPRESSION: 1. Suspicious 2.7 cm mass over the 12 o'clock position of the right breast 4 cm from the nipple correlating to 1 of patient's palpable abnormalities. 2. 2.6 cm suspicious mass over the lower right axilla correlating to patient's second palpable abnormality. 3. Single abnormal lower right axillary lymph node measuring 1.2 cm. 4. 6 mm indeterminate mass at the 11 o'clock position of the right breast 4 cm from the nipple. 5. Probable benign, but indeterminate microcalcifications over the upper outer quadrant of the right breast. RECOMMENDATION: 1. Recommend 3 site biopsy including ultrasound-guided core needle biopsies of the suspicious masses over the 12 o'clock position and lower right axilla as well as the 1.2 cm abnormal lower right axillary lymph node. Pending biopsy results and treatment plan, patient may need additional ultrasound core biopsy of the 6 mm mass at the 11 o'clock position as well as stereotactic biopsy of the right breast microcalcifications. 2. Also recommend mammographic imaging of the left breast in light of the above findings. I have discussed the findings and recommendations with the patient. If applicable, a reminder letter will be sent to the patient regarding the next appointment. BI-RADS CATEGORY  5: Highly suggestive of malignancy. Biopsy scheduling  will be facilitated by the ultrasound technologist prior to patient's departure. Electronically Signed   By: Toribio Agreste M.D.   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  We discussed this course of treatment with associated side effects and potential long-term effects of treatment.  At this time the patient however is more interested in pursuing bilateral mastectomies for management of her right sided breast cancer.  She reports a sister with a prior history of breast cancer undergoing mastectomy.  We discussed that even with mastectomy on the right side we would recommend adjuvant radiation therapy given the biopsy-proven lymph node involvement and the fact that she has multifocal T2 tumors.  She appears receptive to post mastectomy radiation therapy to increase her chances for cure.  PLAN:  MRI / CT CAP / Bone Scan  Genetics  Chemo class / echo  Port placement  Neoadjuvant chemotherapy  Bilateral nipple sparring mastectomies w/ targeted axillary lymph node dissection on the right side; repeat prognostics to be performed on final pathology; referral to plastics (Dr. Arelia) to be placed to discuss reconstructive surgery  Adjuvant radiation therapy  Aromatase inhibitor    ------------------------------------------------  Lynwood CHARM Nasuti, PhD, MD  This document serves as a record of services personally performed by Lynwood Nasuti, MD. It was created on his behalf by Dorthy Fuse, a trained medical scribe. The creation of this record is based on the scribe's personal observations and the provider's statements to them. This document has been checked  and approved by the attending provider.

## 2024-03-07 ENCOUNTER — Encounter: Payer: Self-pay | Admitting: Hematology and Oncology

## 2024-03-07 ENCOUNTER — Other Ambulatory Visit: Payer: Self-pay

## 2024-03-07 DIAGNOSIS — Z803 Family history of malignant neoplasm of breast: Secondary | ICD-10-CM | POA: Insufficient documentation

## 2024-03-07 DIAGNOSIS — Z808 Family history of malignant neoplasm of other organs or systems: Secondary | ICD-10-CM | POA: Insufficient documentation

## 2024-03-08 ENCOUNTER — Other Ambulatory Visit: Payer: Self-pay

## 2024-03-10 ENCOUNTER — Other Ambulatory Visit: Payer: Self-pay

## 2024-03-11 ENCOUNTER — Ambulatory Visit
Admission: RE | Admit: 2024-03-11 | Discharge: 2024-03-11 | Disposition: A | Source: Ambulatory Visit | Attending: Hematology and Oncology

## 2024-03-11 ENCOUNTER — Encounter: Payer: Self-pay | Admitting: Hematology and Oncology

## 2024-03-11 ENCOUNTER — Telehealth: Payer: Self-pay

## 2024-03-11 DIAGNOSIS — C50411 Malignant neoplasm of upper-outer quadrant of right female breast: Secondary | ICD-10-CM

## 2024-03-11 DIAGNOSIS — Z170 Estrogen receptor positive status [ER+]: Secondary | ICD-10-CM

## 2024-03-11 MED ORDER — GADOPICLENOL 0.5 MMOL/ML IV SOLN
6.0000 mL | Freq: Once | INTRAVENOUS | Status: AC | PRN
  Administered 2024-03-11: 6 mL via INTRAVENOUS

## 2024-03-11 NOTE — Telephone Encounter (Signed)
 [id number], ICE COMPRESS: RANDOMIZED TRIAL OF LIMB CRYOCOMPRESSION VERSUS CONTINUOUS COMPRESSION VERSUS LOW CYCLIC COMPRESSION FOR THE PREVENTION OF TAXANE-INDUCED PERIPHERAL NEUROPATHY   Dana Baxter was contacted by phone regarding the above study. Pt did not have time to talk about the studies at that time. She agreed that research team can talk to her when she is at the clinic.  Pt. was thanked for her time.  Abelardo Jock Clinical Research Coordinator [phone] 03/11/2024 2:26 PM

## 2024-03-12 ENCOUNTER — Encounter: Payer: Self-pay | Admitting: Hematology and Oncology

## 2024-03-12 ENCOUNTER — Other Ambulatory Visit: Payer: Self-pay

## 2024-03-12 ENCOUNTER — Encounter (HOSPITAL_BASED_OUTPATIENT_CLINIC_OR_DEPARTMENT_OTHER): Payer: Self-pay | Admitting: General Surgery

## 2024-03-13 ENCOUNTER — Ambulatory Visit (HOSPITAL_COMMUNITY)
Admission: RE | Admit: 2024-03-13 | Discharge: 2024-03-13 | Disposition: A | Discharge status: Home / Self Care | Source: Ambulatory Visit | Attending: Hematology and Oncology | Admitting: Hematology and Oncology

## 2024-03-13 ENCOUNTER — Inpatient Hospital Stay

## 2024-03-13 ENCOUNTER — Ambulatory Visit (HOSPITAL_COMMUNITY): Admission: RE | Admit: 2024-03-13 | Discharge: 2024-03-13 | Attending: Hematology and Oncology

## 2024-03-13 ENCOUNTER — Encounter: Payer: Self-pay | Admitting: Hematology and Oncology

## 2024-03-13 ENCOUNTER — Other Ambulatory Visit: Payer: Self-pay | Admitting: Hematology and Oncology

## 2024-03-13 ENCOUNTER — Inpatient Hospital Stay: Admitting: Pharmacist

## 2024-03-13 DIAGNOSIS — C50411 Malignant neoplasm of upper-outer quadrant of right female breast: Secondary | ICD-10-CM

## 2024-03-13 DIAGNOSIS — C50611 Malignant neoplasm of axillary tail of right female breast: Secondary | ICD-10-CM

## 2024-03-13 DIAGNOSIS — Z0189 Encounter for other specified special examinations: Secondary | ICD-10-CM | POA: Diagnosis not present

## 2024-03-13 DIAGNOSIS — Z170 Estrogen receptor positive status [ER+]: Secondary | ICD-10-CM | POA: Diagnosis not present

## 2024-03-13 DIAGNOSIS — Z5111 Encounter for antineoplastic chemotherapy: Secondary | ICD-10-CM | POA: Diagnosis not present

## 2024-03-13 LAB — ECHOCARDIOGRAM COMPLETE
AR max vel: 3.01 cm2
AV Area VTI: 2.62 cm2
AV Area mean vel: 2.7 cm2
AV Mean grad: 3 mmHg
AV Peak grad: 6.1 mmHg
Ao pk vel: 1.23 m/s
Area-P 1/2: 3.48 cm2
Calc EF: 57.4 %
MV VTI: 2.93 cm2
S' Lateral: 3.2 cm
Single Plane A2C EF: 58.7 %
Single Plane A4C EF: 55.8 %

## 2024-03-13 MED ORDER — TECHNETIUM TC 99M MEDRONATE IV KIT
20.0000 | PACK | Freq: Once | INTRAVENOUS | Status: AC | PRN
Start: 2024-03-13 — End: 2024-03-13
  Administered 2024-03-13: 19.4 via INTRAVENOUS

## 2024-03-13 MED ORDER — SODIUM CHLORIDE (PF) 0.9 % IJ SOLN
INTRAMUSCULAR | Status: AC
  Filled 2024-03-13: qty 50

## 2024-03-13 MED ORDER — IOHEXOL 300 MG/ML  SOLN
100.0000 mL | Freq: Once | INTRAMUSCULAR | Status: AC | PRN
  Administered 2024-03-13: 100 mL via INTRAVENOUS

## 2024-03-13 NOTE — Progress Notes (Signed)
 [facility]       Telephone: [phone]?Fax: [phone]   Oncology Clinical Pharmacist Practitioner Initial Assessment  Dana Baxter is a 53 y.o. female with a diagnosis of breast cancer. They were contacted today via in-person visit. She is accompanied by her husband.  Indication/Regimen TCHP: Trastuzumab (Herceptin) and pertuzumab (Perjeta) and docetaxel (Taxotere) and carboplatin (Paraplatin) are being used appropriately for treatment of breast cancer by Dr. Vinay Gudena.      Wt Readings from Last 1 Encounters:  03/06/24 117 lb 9.6 oz (53.3 kg)    Estimated body surface area is 1.51 meters squared as calculated from the following:   Height as of 03/12/24: 5' 1 (1.549 m).   Weight as of 03/12/24: 117 lb 8.1 oz (53.3 kg).  The dosing regimen is every 21 days for 6 cycles  Trastuzumab (8 mg/kg load, 6 mg/kg maintenance) on Day 1 Pertuzumab (840 mg load, 420 mg maintenance) on Day 1 Docetaxel (75 mg/m2) on Day 1 Carboplatin (AUC 6) on Day 1 Pegfilgrastim (6 mg) on Day 3  Dose Modifications Dr. Odean is giving pertuzumab at 420 mg for cycle 1   Allergies Allergies  Allergen Reactions   Penicillins Anaphylaxis   Sulfa Antibiotics Anaphylaxis   Metronidazole  Nausea Only   Morphine Rash    Vitals    03/12/2024   11:34 AM 03/06/2024   12:48 PM 02/28/2019   11:04 AM  Oncology Vitals  Height 155 cm    Weight 53.3 kg 53.343 kg 66.089 kg  Weight (lbs) 117 lbs 8 oz 117 lbs 10 oz 145 lbs 11 oz  BMI 22.2 kg/m2    Temp  98 F (36.7 C)   Pulse Rate  81 72  BP  108/78 117/82  Resp  18   SpO2  98 %   BSA (m2) 1.51 m2       Laboratory Data    Latest Ref Rng & Units 03/06/2024   12:31 PM 02/28/2019   11:59 AM  CBC EXTENDED  WBC 4.0 - 10.5 K/uL 6.0  6.5   RBC 3.87 - 5.11 MIL/uL 3.77  4.21   Hemoglobin 12.0 - 15.0 g/dL 87.4  85.9   HCT 63.9 - 46.0 % 36.6  42.1   Platelets 150 - 400 K/uL 256  236   NEUT# 1.7 - 7.7 K/uL 3.3    Lymph# 0.7 -  4.0 K/uL 2.2         Latest Ref Rng & Units 03/06/2024   12:31 PM 02/28/2019   11:59 AM  CMP  Glucose 70 - 99 mg/dL 99  89   BUN 6 - 20 mg/dL 17  12   Creatinine 9.55 - 1.00 mg/dL 9.17  9.17   Sodium 864 - 145 mmol/L 138  139   Potassium 3.5 - 5.1 mmol/L 3.9  4.2   Chloride 98 - 111 mmol/L 103  103   CO2 22 - 32 mmol/L 30  22   Calcium 8.9 - 10.3 mg/dL 9.2  9.4   Total Protein 6.5 - 8.1 g/dL 6.4  6.4   Total Bilirubin 0.0 - 1.2 mg/dL 0.4  0.3   Alkaline Phos 38 - 126 U/L 56  68   AST 15 - 41 U/L 16  15   ALT 0 - 44 U/L 21  10    Contraindications Contraindications were reviewed? Yes Contraindications to therapy were identified? No   Safety Precautions (written information also provided) The following safety precautions for the use  of TCHP were reviewed:  Decreased hemoglobin, part of the red blood cells that carry iron and oxygen Decreased platelet count and increased risk of bleeding Decreased white blood cells (WBCs) and increased risk for infection Fever: reviewed the importance of having a thermometer and the Centers for Disease Control and Prevention (CDC) definition of fever which is 100.32F (38C) or higher. Patient should call 24/7 triage at [phone] if experiencing a fever or any other symptoms Hair Loss Muscle or joint pain or weakness Fatigue Nausea or vomiting Mouth sores Diarrhea Irregular menses (if applicable) Peripheral neuropathy: numbness or tingling in hands and feet Fluid retention or swelling (edema) Nail Changes Rash or itchy skin Fluid retention or swelling (edema) Taste changes Changes in kidney function Changes in electrolytes and other laboratory values (low potassium, low magnesium) Cardiotoxicity from trastuzumab Infusion reactions Pneumonitis from trastuzumab Docetaxel irritating veins Liver toxicity from docetaxel Docetaxel can cause eye pain, blurred vision, tearing, and light sensitivity Handling body fluids and waste Intimacy,  sexual activity, contraception, fertility  Medication Reconciliation Current Outpatient Medications  Medication Sig Dispense Refill   omeprazole (PRILOSEC OTC) 20 MG tablet Take 20 mg by mouth daily.     ondansetron (ZOFRAN) 8 MG tablet Take 1 tablet (8 mg total) by mouth every 8 (eight) hours as needed for nausea or vomiting. Start on the third day after chemotherapy. 30 tablet 1   prochlorperazine (COMPAZINE) 10 MG tablet Take 1 tablet (10 mg total) by mouth every 6 (six) hours as needed for nausea or vomiting. 30 tablet 1   dexamethasone (DECADRON) 4 MG tablet Take 1 tablet day before chemo and 1 tablet day after chemo with food (Patient not taking: Reported on 03/13/2024) 30 tablet 1   lidocaine-prilocaine (EMLA) cream Apply to affected area once (Patient not taking: Reported on 03/13/2024) 30 g 3   No current facility-administered medications for this visit.    Medication reconciliation is based on the patient's most recent medication list in the electronic medical record (EMR) including herbal products and OTC medications.   The patient's medication list was reviewed today with the patient? Yes   Drug-drug interactions (DDIs) DDIs were evaluated? Yes Significant DDIs identified? No   Drug-Food Interactions Drug-food interactions were evaluated? Yes Drug-food interactions identified? Avoid grapefruit products  Follow-up Plan  Treatment start date: 03/20/24 Port placement date: 03/19/24 ECHO date: 03/13/24 We reviewed the prescriptions, premedications, and treatment regimen with the patient. Possible side effects of the treatment regimen were reviewed and management strategies were discussed. Can use loperamide as needed for diarrhea, loratadine as needed for G-CSF bone pain, and Senna-S as needed for constipation.  Clinical pharmacy will assist Dr. Vinay Gudena and Dana Baxter on an as needed basis going forward  Dana Baxter participated in the discussion, expressed  understanding, and voiced agreement with the above plan. All questions were answered to her satisfaction. The patient was advised to contact the clinic at [phone] with any questions or concerns prior to her return visit.   I spent 60 minutes assessing the patient.  Dana Baxter A. Baxter, PharmD, BCOP, CPP  Dana Baxter, RPH-CPP, 03/13/2024 3:02 PM  **Disclaimer: This note was dictated with voice recognition software. Similar sounding words can inadvertently be transcribed and this note may contain transcription errors which may not have been corrected upon publication of note.**

## 2024-03-13 NOTE — Telephone Encounter (Signed)
 Effectiveness of Out-of-Pocket Psychologist, forensic ([employer]) in Cancer Patients   This CRC met Mrs. And Mr. Kyne this afternoon before her Patient Education Appt. Pt stated they are not interested in the participation in any of the studies.  Pt was thanked for her time.   Abelardo Jock Clinical Research Coordinator [phone] 03/13/2024 3:03 PM

## 2024-03-13 NOTE — Progress Notes (Signed)
 Pharmacist Chemotherapy Monitoring - Initial Assessment    Anticipated start date: 1029/25   The following has been reviewed per standard work regarding the patient's treatment regimen: The patient's diagnosis, treatment plan and drug doses, and organ/hematologic function Lab orders and baseline tests specific to treatment regimen  The treatment plan start date, drug sequencing, and pre-medications Prior authorization status  Patient's documented medication list, including drug-drug interaction screen and prescriptions for anti-emetics and supportive care specific to the treatment regimen The drug concentrations, fluid compatibility, administration routes, and timing of the medications to be used The patient's access for treatment and lifetime cumulative dose history, if applicable  The patient's medication allergies and previous infusion related reactions, if applicable   Changes made to treatment plan:  N/A  Follow up needed:  Pending authorization for treatment  ECHO results   Marlee Eleanor Neighbors, RPH, 03/13/2024  12:15 PM

## 2024-03-13 NOTE — Progress Notes (Signed)
  Pt given chg soap and ensure presurgery drink. Pt didn't have time to hear verbal instructions but said I know what to do, they already told me.         Enhanced Recovery after Surgery for Orthopedics Enhanced Recovery after Surgery is a protocol used to improve the stress on your body and your recovery after surgery.  Patient Instructions  The night before surgery:  No food after midnight. ONLY clear liquids after midnight  The day of surgery (if you do NOT have diabetes):  Drink ONE (1) Pre-Surgery Clear Ensure as directed.   This drink was given to you during your hospital  pre-op appointment visit. The pre-op nurse will instruct you on the time to drink the  Pre-Surgery Ensure depending on your surgery time. Finish the drink at the designated time by the pre-op nurse.  Nothing else to drink after completing the  Pre-Surgery Clear Ensure.  The day of surgery (if you have diabetes): Drink ONE (1) Gatorade 2 (G2) as directed. This drink was given to you during your hospital  pre-op appointment visit.  The pre-op nurse will instruct you on the time to drink the   Gatorade 2 (G2) depending on your surgery time. Color of the Gatorade may vary. Red is not allowed. Nothing else to drink after completing the  Gatorade 2 (G2).         If you have questions, please contact your surgeon's office.

## 2024-03-14 ENCOUNTER — Telehealth: Payer: Self-pay | Admitting: *Deleted

## 2024-03-14 ENCOUNTER — Encounter: Payer: Self-pay | Admitting: *Deleted

## 2024-03-14 NOTE — Telephone Encounter (Signed)
 [facility] f/u call. Spoke with patient about her previous week of scans/chemo ed. She has upcoming appt for port and chemo. Questions answered and she is aware to contact navigator for any future concerns/needs.

## 2024-03-15 ENCOUNTER — Encounter: Payer: Self-pay | Admitting: Hematology and Oncology

## 2024-03-15 ENCOUNTER — Telehealth: Payer: Self-pay

## 2024-03-15 ENCOUNTER — Telehealth: Payer: Self-pay | Admitting: *Deleted

## 2024-03-15 ENCOUNTER — Inpatient Hospital Stay

## 2024-03-15 NOTE — Progress Notes (Signed)
 [facility] Clinical Social Work  Initial Assessment   Dana Baxter is a 53 y.o. year old female contacted by phone. Clinical Social Work was referred by distress screen protocol for assessment of psychosocial needs. Patient was at work so assessment was brief, but CSW Intern will call again next week.  SDOH (Social Determinants of Health) assessments performed: No   SDOH Screenings   Food Insecurity: No Food Insecurity (03/05/2024)  Housing: Low Risk  (03/05/2024)  Transportation Needs: No Transportation Needs (03/05/2024)  Utilities: Not At Risk (03/05/2024)  Tobacco Use: Low Risk  (03/12/2024)    PHQ 2/9:     No data to display           Distress Screen completed: No    03/13/2024    4:33 PM  ONCBCN DISTRESS SCREENING  Screening Type Initial Screening  How much distress have you been experiencing in the past week? (0-10) 10  Practical concerns type Work;Housing/Utilities;Finances;Insurance  Emotional concerns type Worry or anxiety;Sadness or depression;Fear;Changes in appearance;Feelings of worthlessness or being a burden  Physical Concerns Type  Pain;Fatigue;Memory or concentration;Sexual health;Loss or change of physical abilities      Family/Social Information:  Housing Arrangement: patient lives with husband.Daughter, son in law, and grandchild live a few blocks away. Son, daughter in law, and another grandchild live in [place]. Family members/support persons in your life? Family, Friends, and Art gallery manager concerns: no  Employment: Working part time. 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 Due to nature of work (home visits, health fairs) pt has discontinued this job in preparation for treatment, but currently works part time at a Lobbyist. Income source: Employment Financial concerns: Yes, due to illness and/or loss of work during treatment Type of concern: Utilities, Government social research officer, and Medical bills Food access  concerns: no Religious or spiritual practice: Not known Advanced directives: No Services Currently in place:  [employer]  Coping/ Adjustment to diagnosis: Patient understands treatment plan and what happens next? yes Concerns about diagnosis and/or treatment: How I will care for other members of my family, Losing my job and/or losing income, Afraid of cancer, and How I will pay for the services I need Patient reported stressors: Anxiety/ nervousness, Adjusting to my illness, and continued involvement with care of grandchild while in treatment.  Hopes and/or priorities: That treatment is successful and to continue to be able to spend time with family and grandchildren during tx. Current coping skills/ strengths: Average or above average intelligence , Capable of independent living , General fund of knowledge , Motivation for treatment/growth , and Supportive family/friends     SUMMARY: Current SDOH Barriers:  Financial concerns due to loss of work and/or income during treatment.  Clinical Social Work Clinical Goal(s):  Patient will work with SW to address concerns related to loss of income during treatment and reducing anxiety.  Interventions- Pt had to end call abruptly. CSW Intern will call back to follow next week about the following: Discuss common feeling and emotions when being diagnosed with cancer, and the importance of support during treatment Inform patient of the support team roles and support services at [facility] Provided CSW contact information and encourage patient to call with any questions or concerns Patient will gather household income information so CSW Intern can identify any financial resources she may qualify for.   Follow Up Plan: CSW will follow-up with patient by phone  Patient verbalizes understanding of plan: Yes    Dana Baxter Clinical Social Worker  [facility]

## 2024-03-15 NOTE — Telephone Encounter (Signed)
 Received call from pt requesting to go over recent CT scan.  MD out of office, pt educated that MD will review this week during visit.

## 2024-03-15 NOTE — Telephone Encounter (Signed)
 CSW Intern attempted to contact patient by phone per distress screen protocol. Intern left VM with direct contact information, and will try again next week if pt does not return call.  Thersia Daring Clinical Social Work Inter [facility]

## 2024-03-15 NOTE — Progress Notes (Signed)
 I reviewed patient visit by social work Tax inspector. I concur with the treatment plan as documented in the SW intern's note.   Nazareth Norenberg E Lylianna Fraiser, LCSW Clinical Child psychotherapist

## 2024-03-17 NOTE — H&P (Signed)
 49 yof who is otherwise healthy presents with 2 palpable breast masses on right side. She has FH of breast cancer in her sister. She has central upper breast mass and right axillary tail as well as single abnormal node. Breast lesions are both grade II IDC that is 5% er pos, pr neg, her 2 positive and Ki is 30%. Node is positive. She is here with her husband today. 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 She lives in [place].  Review of Systems: A complete review of systems was obtained from the patient. I have reviewed this information and discussed as appropriate with the patient. See HPI as well for other ROS.  Review of Systems  All other systems reviewed and are negative.   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 No pertinent surgical history.   Allergies  Allergen Reactions  Sulfa (Sulfonamide Antibiotics) Anaphylaxis   Current Outpatient Medications on File Prior to Visit  Medication Sig Dispense Refill  metroNIDAZOLE  (FLAGYL ) 500 MG tablet Take 500 mg by mouth every 8 (eight) hours  nitrofurantoin , macrocrystal-monohydrate, (MACROBID ) 100 MG capsule 1 (ONE) CAPSULE AS NEEDED AFTER SEX    Family History  Problem Relation Age of Onset  Breast cancer Sister    Social History   Tobacco Use  Smoking Status Never  Smokeless Tobacco Never  Marital status: Married  Tobacco Use  Smoking status: Never  Smokeless tobacco: Never  Substance and Sexual Activity  Alcohol use: Never  Drug use: Never    Objective:   Physical Exam Vitals reviewed.  Constitutional:  Appearance: Normal appearance.  Chest:  Breasts: Right: Mass present. No inverted nipple or nipple discharge.  Left: No inverted nipple, mass or nipple discharge.  Lymphadenopathy:   Upper Body:  Right upper body: No supraclavicular or axillary adenopathy.  Left upper body: No supraclavicular or axillary adenopathy.  Neurological:  Mental Status: She is alert.   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 Discussed port placement in left IJ and will work to schedule this asap. We discussed at length rationale for primary systemic therapy to possibly do less surgery but more for prognostic information/treatment decisions. She is agreeable.  Discussed eventual lumpectomy vs mastectomy and TAD. She now would like mastectomy (think she might end up being NSM candidate). Radiotherapy on right may complicate a little. Will have her see plastics in next couple months for planning once all above is done.

## 2024-03-18 ENCOUNTER — Encounter: Payer: Self-pay | Admitting: *Deleted

## 2024-03-19 ENCOUNTER — Ambulatory Visit (HOSPITAL_COMMUNITY)

## 2024-03-19 ENCOUNTER — Encounter (HOSPITAL_BASED_OUTPATIENT_CLINIC_OR_DEPARTMENT_OTHER)
Admission: RE | Disposition: A | Discharge status: Home / Self Care | Payer: Self-pay | Source: Home / Self Care | Attending: General Surgery

## 2024-03-19 ENCOUNTER — Ambulatory Visit (HOSPITAL_BASED_OUTPATIENT_CLINIC_OR_DEPARTMENT_OTHER): Admitting: Anesthesiology

## 2024-03-19 ENCOUNTER — Other Ambulatory Visit: Payer: Self-pay

## 2024-03-19 ENCOUNTER — Telehealth: Payer: Self-pay | Admitting: *Deleted

## 2024-03-19 ENCOUNTER — Ambulatory Visit (HOSPITAL_BASED_OUTPATIENT_CLINIC_OR_DEPARTMENT_OTHER)
Admission: RE | Admit: 2024-03-19 | Discharge: 2024-03-19 | Disposition: A | Discharge status: Home / Self Care | Attending: General Surgery | Admitting: General Surgery

## 2024-03-19 ENCOUNTER — Encounter: Payer: Self-pay | Admitting: Hematology and Oncology

## 2024-03-19 ENCOUNTER — Encounter (HOSPITAL_BASED_OUTPATIENT_CLINIC_OR_DEPARTMENT_OTHER): Payer: Self-pay | Admitting: General Surgery

## 2024-03-19 DIAGNOSIS — C50911 Malignant neoplasm of unspecified site of right female breast: Secondary | ICD-10-CM

## 2024-03-19 DIAGNOSIS — Z170 Estrogen receptor positive status [ER+]: Secondary | ICD-10-CM | POA: Insufficient documentation

## 2024-03-19 DIAGNOSIS — Z1722 Progesterone receptor negative status: Secondary | ICD-10-CM | POA: Insufficient documentation

## 2024-03-19 DIAGNOSIS — C50611 Malignant neoplasm of axillary tail of right female breast: Secondary | ICD-10-CM | POA: Diagnosis present

## 2024-03-19 DIAGNOSIS — Z1731 Human epidermal growth factor receptor 2 positive status: Secondary | ICD-10-CM | POA: Insufficient documentation

## 2024-03-19 HISTORY — PX: PORTACATH PLACEMENT: SHX2246

## 2024-03-19 SURGERY — INSERTION, TUNNELED CENTRAL VENOUS DEVICE, WITH PORT
Anesthesia: General | Site: Chest

## 2024-03-19 MED ORDER — HEPARIN SOD (PORK) LOCK FLUSH 100 UNIT/ML IV SOLN
INTRAVENOUS | Status: DC | PRN
  Administered 2024-03-19: 500 [IU] via INTRAVENOUS

## 2024-03-19 MED ORDER — MIDAZOLAM HCL 2 MG/2ML IJ SOLN
INTRAMUSCULAR | Status: AC
  Filled 2024-03-19: qty 2

## 2024-03-19 MED ORDER — PHENYLEPHRINE HCL (PRESSORS) 10 MG/ML IV SOLN
INTRAVENOUS | Status: DC | PRN
Start: 2024-03-19 — End: 2024-03-19
  Administered 2024-03-19 (×2): 80 ug via INTRAVENOUS

## 2024-03-19 MED ORDER — BUPIVACAINE HCL (PF) 0.25 % IJ SOLN
INTRAMUSCULAR | Status: DC | PRN
  Administered 2024-03-19: 7 mL

## 2024-03-19 MED ORDER — PROPOFOL 10 MG/ML IV BOLUS
INTRAVENOUS | Status: AC
Start: 2024-03-19 — End: 2024-03-19
  Filled 2024-03-19: qty 20

## 2024-03-19 MED ORDER — LIDOCAINE 2% (20 MG/ML) 5 ML SYRINGE
INTRAMUSCULAR | Status: DC | PRN
  Administered 2024-03-19: 60 mg via INTRAVENOUS

## 2024-03-19 MED ORDER — ACETAMINOPHEN 500 MG PO TABS
ORAL_TABLET | ORAL | Status: AC
  Filled 2024-03-19: qty 2

## 2024-03-19 MED ORDER — EPHEDRINE SULFATE (PRESSORS) 25 MG/5ML IV SOSY
PREFILLED_SYRINGE | INTRAVENOUS | Status: DC | PRN
  Administered 2024-03-19: 10 mg via INTRAVENOUS

## 2024-03-19 MED ORDER — ONDANSETRON HCL 4 MG/2ML IJ SOLN
INTRAMUSCULAR | Status: DC | PRN
Start: 2024-03-19 — End: 2024-03-19
  Administered 2024-03-19: 4 mg via INTRAVENOUS

## 2024-03-19 MED ORDER — FENTANYL CITRATE (PF) 100 MCG/2ML IJ SOLN
INTRAMUSCULAR | Status: DC | PRN
  Administered 2024-03-19 (×2): 25 ug via INTRAVENOUS

## 2024-03-19 MED ORDER — LACTATED RINGERS IV SOLN: INTRAVENOUS | Status: DC

## 2024-03-19 MED ORDER — CIPROFLOXACIN IN D5W 400 MG/200ML IV SOLN
400.0000 mg | INTRAVENOUS | Status: AC
  Administered 2024-03-19: 400 mg via INTRAVENOUS

## 2024-03-19 MED ORDER — AMISULPRIDE (ANTIEMETIC) 5 MG/2ML IV SOLN: 10.0000 mg | Freq: Once | INTRAVENOUS | Status: DC | PRN

## 2024-03-19 MED ORDER — CIPROFLOXACIN IN D5W 400 MG/200ML IV SOLN
INTRAVENOUS | Status: AC
  Filled 2024-03-19: qty 200

## 2024-03-19 MED ORDER — BUPIVACAINE HCL (PF) 0.25 % IJ SOLN
INTRAMUSCULAR | Status: AC
  Filled 2024-03-19: qty 30

## 2024-03-19 MED ORDER — OXYCODONE HCL 5 MG/5ML PO SOLN: 5.0000 mg | Freq: Once | ORAL | Status: DC | PRN

## 2024-03-19 MED ORDER — LIDOCAINE 2% (20 MG/ML) 5 ML SYRINGE
INTRAMUSCULAR | Status: AC
  Filled 2024-03-19: qty 5

## 2024-03-19 MED ORDER — HEPARIN (PORCINE) IN NACL 1000-0.9 UT/500ML-% IV SOLN
INTRAVENOUS | Status: AC
  Filled 2024-03-19: qty 500

## 2024-03-19 MED ORDER — HEPARIN (PORCINE) IN NACL 2-0.9 UNITS/ML
INTRAMUSCULAR | Status: AC | PRN
  Administered 2024-03-19: 500 mL via INTRAVENOUS

## 2024-03-19 MED ORDER — DEXAMETHASONE SODIUM PHOSPHATE 4 MG/ML IJ SOLN
INTRAMUSCULAR | Status: DC | PRN
  Administered 2024-03-19: 5 mg via INTRAVENOUS

## 2024-03-19 MED ORDER — ONDANSETRON HCL 4 MG/2ML IJ SOLN
INTRAMUSCULAR | Status: AC
  Filled 2024-03-19: qty 2

## 2024-03-19 MED ORDER — PHENYLEPHRINE 80 MCG/ML (10ML) SYRINGE FOR IV PUSH (FOR BLOOD PRESSURE SUPPORT)
PREFILLED_SYRINGE | INTRAVENOUS | Status: AC
Start: 2024-03-19 — End: 2024-03-19
  Filled 2024-03-19: qty 10

## 2024-03-19 MED ORDER — MIDAZOLAM HCL 5 MG/5ML IJ SOLN
INTRAMUSCULAR | Status: DC | PRN
  Administered 2024-03-19: 2 mg via INTRAVENOUS

## 2024-03-19 MED ORDER — CHLORHEXIDINE GLUCONATE CLOTH 2 % EX PADS: 6.0000 | MEDICATED_PAD | Freq: Once | CUTANEOUS | Status: DC

## 2024-03-19 MED ORDER — PROPOFOL 10 MG/ML IV BOLUS
INTRAVENOUS | Status: DC | PRN
  Administered 2024-03-19: 180 mg via INTRAVENOUS
  Administered 2024-03-19: 20 mg via INTRAVENOUS

## 2024-03-19 MED ORDER — HEPARIN SOD (PORK) LOCK FLUSH 100 UNIT/ML IV SOLN
INTRAVENOUS | Status: AC
  Filled 2024-03-19: qty 5

## 2024-03-19 MED ORDER — ACETAMINOPHEN 500 MG PO TABS
1000.0000 mg | ORAL_TABLET | ORAL | Status: AC
  Administered 2024-03-19: 1000 mg via ORAL

## 2024-03-19 MED ORDER — OXYCODONE HCL 5 MG PO TABS: 5.0000 mg | ORAL_TABLET | Freq: Once | ORAL | Status: DC | PRN

## 2024-03-19 MED ORDER — FENTANYL CITRATE (PF) 100 MCG/2ML IJ SOLN: 25.0000 ug | INTRAMUSCULAR | Status: DC | PRN

## 2024-03-19 MED ORDER — FENTANYL CITRATE (PF) 100 MCG/2ML IJ SOLN
INTRAMUSCULAR | Status: AC
  Filled 2024-03-19: qty 2

## 2024-03-19 SURGICAL SUPPLY — 40 items
BAG DECANTER FOR FLEXI CONT (MISCELLANEOUS) ×1 IMPLANT
BENZOIN TINCTURE PRP APPL 2/3 (GAUZE/BANDAGES/DRESSINGS) ×1 IMPLANT
BLADE SURG 11 STRL SS (BLADE) ×1 IMPLANT
BLADE SURG 15 STRL LF DISP TIS (BLADE) ×1 IMPLANT
CANISTER SUCT 1200ML W/VALVE (MISCELLANEOUS) IMPLANT
CHLORAPREP W/TINT 26 (MISCELLANEOUS) ×1 IMPLANT
CLEANER CAUTERY TIP PAD (MISCELLANEOUS) IMPLANT
COVER BACK TABLE 60X90IN (DRAPES) ×1 IMPLANT
COVER MAYO STAND STRL (DRAPES) ×1 IMPLANT
DERMABOND ADVANCED .7 DNX12 (GAUZE/BANDAGES/DRESSINGS) ×1 IMPLANT
DRAPE C-ARM 42X72 X-RAY (DRAPES) ×1 IMPLANT
DRAPE LAPAROSCOPIC ABDOMINAL (DRAPES) ×1 IMPLANT
DRAPE UTILITY XL STRL (DRAPES) ×1 IMPLANT
DRSG TEGADERM 4X4.75 (GAUZE/BANDAGES/DRESSINGS) IMPLANT
ELECT COATED BLADE 2.86 ST (ELECTRODE) ×1 IMPLANT
ELECTRODE REM PT RTRN 9FT ADLT (ELECTROSURGICAL) ×1 IMPLANT
GAUZE 4X4 16PLY ~~LOC~~+RFID DBL (SPONGE) ×1 IMPLANT
GAUZE SPONGE 4X4 12PLY STRL LF (GAUZE/BANDAGES/DRESSINGS) ×1 IMPLANT
GLOVE BIO SURGEON STRL SZ7 (GLOVE) ×1 IMPLANT
GLOVE BIOGEL PI IND STRL 7.5 (GLOVE) ×1 IMPLANT
GOWN STRL REUS W/ TWL LRG LVL3 (GOWN DISPOSABLE) ×2 IMPLANT
KIT CVR 48X5XPRB PLUP LF (MISCELLANEOUS) IMPLANT
KIT PORT POWER 8FR ISP CVUE (Port) IMPLANT
NDL HYPO 25X1 1.5 SAFETY (NEEDLE) ×1 IMPLANT
NDL SAFETY ECLIPSE 18X1.5 (NEEDLE) IMPLANT
NEEDLE HYPO 25X1 1.5 SAFETY (NEEDLE) ×1 IMPLANT
PACK BASIN DAY SURGERY FS (CUSTOM PROCEDURE TRAY) ×1 IMPLANT
PENCIL SMOKE EVACUATOR (MISCELLANEOUS) ×1 IMPLANT
SLEEVE SCD COMPRESS KNEE MED (STOCKING) ×1 IMPLANT
SPIKE FLUID TRANSFER (MISCELLANEOUS) IMPLANT
STRIP CLOSURE SKIN 1/2X4 (GAUZE/BANDAGES/DRESSINGS) ×1 IMPLANT
SUT MNCRL AB 4-0 PS2 18 (SUTURE) ×1 IMPLANT
SUT PROLENE 2 0 SH DA (SUTURE) ×1 IMPLANT
SUT SILK 2 0 TIES 17X18 (SUTURE) IMPLANT
SUT VIC AB 3-0 SH 27X BRD (SUTURE) ×1 IMPLANT
SYR 5ML LUER SLIP (SYRINGE) ×1 IMPLANT
SYR CONTROL 10ML LL (SYRINGE) ×1 IMPLANT
TOWEL GREEN STERILE FF (TOWEL DISPOSABLE) ×1 IMPLANT
TUBE CONNECTING 20X1/4 (TUBING) IMPLANT
YANKAUER SUCT BULB TIP NO VENT (SUCTIONS) IMPLANT

## 2024-03-19 NOTE — Interval H&P Note (Signed)
 History and Physical Interval Note:  03/19/2024 10:50 AM  Dana Baxter  has presented today for surgery, with the diagnosis of BREAST CANCER.  The various methods of treatment have been discussed with the patient and family. After consideration of risks, benefits and other options for treatment, the patient has consented to  Procedure(s) with comments: INSERTION, TUNNELED CENTRAL VENOUS DEVICE, WITH PORT (N/A) - PORT PLACEMENT WITH ULTRASOUND GUIDANCE as a surgical intervention.  The patient's history has been reviewed, patient examined, no change in status, stable for surgery.  I have reviewed the patient's chart and labs.  Questions were answered to the patient's satisfaction.     [name]

## 2024-03-19 NOTE — Discharge Instructions (Addendum)
 A prescription for pain medication may be given to you upon discharge. Take your pain medication as prescribed, if needed. If narcotic pain medicine is not needed, then you make take acetaminophen (Tylenol) or ibuprofen (Advil) as needed.  Take your usually prescribed medications unless otherwise directed. If you need a refill on your pain medication, please contact our office. All narcotic pain medicine now requires a paper prescription.  Phoned in and fax refills are no longer allowed by law.  Prescriptions will not be filled after 5 pm or on weekends.  You should follow a light diet for the remainder of the day after your procedure. Most patients will experience some mild swelling and/or bruising in the area of the incision. It may take several days to resolve. It is common to experience some constipation if taking pain medication after surgery. Increasing fluid intake and taking a stool softener (such as Colace) will usually help or prevent this problem from occurring. A mild laxative (Milk of Magnesia or Miralax) should be taken according to package directions if there are no bowel movements after 48 hours.  Unless discharge instructions indicate otherwise, you may remove your bandages 48 hours after surgery, and you may shower at that time. You may have steri-strips (small white skin tapes) in place directly over the incision.  These strips should be left on the skin for 7-10 days.  If your surgeon used Dermabond (skin glue) on the incision, you may shower in 24 hours.  The glue will flake off over the next 2-3 weeks.  If your port is left accessed at the end of surgery (needle left in port), the dressing cannot get wet and should only by changed by a healthcare professional. When the port is no longer accessed (when the needle has been removed), follow step 7.   ACTIVITIES:  Limit activity involving your arms for the next 72 hours. Do no strenuous exercise or activity for 1 week. You may drive  when you are no longer taking prescription pain medication, you can comfortably wear a seatbelt, and you can maneuver your car. 10.You may need to see your doctor in the office for a follow-up appointment.  Please       check with your doctor.  11.When you receive a new Port-a-Cath, you will get a product guide and        ID card.  Please keep them in case you need them.  WHEN TO CALL YOUR DOCTOR [phone]): Fever over 101.0 Chills Continued bleeding from incision Increased redness and tenderness at the site Shortness of breath, difficulty breathing   The clinic staff is available to answer your questions during regular business hours. Please don't hesitate to call and ask to speak to one of the nurses or medical assistants for clinical concerns. If you have a medical emergency, go to the nearest emergency room or call 911.  A surgeon from [facility] is always on call at the hospital.     For further information, please visit [email or web address]     Post Anesthesia Home Care Instructions  Activity: Get plenty of rest for the remainder of the day. A responsible individual must stay with you for 24 hours following the procedure.  For the next 24 hours, DO NOT: -Drive a car -Advertising copywriter -Drink alcoholic beverages -Take any medication unless instructed by your physician -Make any legal decisions or sign important papers.  Meals: Start with liquid foods such as gelatin or soup. Progress to regular  foods as tolerated. Avoid greasy, spicy, heavy foods. If nausea and/or vomiting occur, drink only clear liquids until the nausea and/or vomiting subsides. Call your physician if vomiting continues.  Special Instructions/Symptoms: Your throat may feel dry or sore from the anesthesia or the breathing tube placed in your throat during surgery. If this causes discomfort, gargle with warm salt water. The discomfort should disappear within 24 hours.  If you  had a scopolamine patch placed behind your ear for the management of post- operative nausea and/or vomiting:  1. The medication in the patch is effective for 72 hours, after which it should be removed.  Wrap patch in a tissue and discard in the trash. Wash hands thoroughly with soap and water. 2. You may remove the patch earlier than 72 hours if you experience unpleasant side effects which may include dry mouth, dizziness or visual disturbances. 3. Avoid touching the patch. Wash your hands with soap and water after contact with the patch.      Next dose of tyelnol may be taken 4:15p

## 2024-03-19 NOTE — Telephone Encounter (Signed)
 Spoke with husband Dana Baxter to let him know that I am cancelling the lab appt for tomorrow 10/29 as she had labs drawn with her port placement today.  Appt cancelled.

## 2024-03-19 NOTE — Op Note (Signed)
 Preoperative diagnosis: Clinical stage II right breast cancer Postoperative diagnosis: Same as above Procedure: Left subclavian port placement Surgeon: Dr. Adina Bury Anesthesia: General Estimated blood loss: Minimal Complications: Unable to access diminutive internal jugular vein so switched to the subclavian vein Drains: None Specimens: None Sponge needle count was correct completion Disposition recovery stable initially  Indications: This a 53 year old female who has 2 right-sided breast masses.  She also has an abnormal node.  Node is positive and the lesions are grade 2 invasive ductal carcinoma that is 5% ER positive, PR negative and HER2 positive.  We have elected to proceed with primary systemic therapy.  Procedure: After informed consent was obtained she was taken to the operating room.  She was given antibiotics.  SCDs were placed.  She was placed under general anesthesia without complication.  She was prepped and draped in standard sterile surgical fashion.  Surgical timeout was then performed.  I located her left internal jugular vein with ultrasound.  Her internal jugular vein was very diminutive in its length.  I then attempted to access this.  I was able to access the jugular vein at several times but was really unable to pass a wire I think because of the small size of the vessel.  I elected then to try the subclavian vein.  I was able to access this on the first pass underneath the clavicle.  This was clearly in the vein.  This was a low pressure return of blood.  I then placed a wire.  The wire was confirmed to be in correct position by fluoroscopy.  I then made an incision created a pocket below this.  I then tunneled the line between the 2 sides.  I then placed the dilator over the wire and watched this going to position with fluoroscopy.  I then removed the wire.  I then placed the line through the peel-away sheath.  I then removed the peel-away sheath and pulled the line  back to be in the distal vena cava.  The line is ready for use.  I then attached this to the port and placed a port in the pocket.  I sutured this into position with a 2-0 Prolene.  This aspirated blood and flushed easily.  I then closed this with 3-0 Vicryl and 4-0 Monocryl.  Glue and a Steri-Strip were applied.  I then accessed the port for use tomorrow.  I was able to aspirate blood and flushed this.  I placed heparin in the port.  I then placed a large sterile dressing overlying all of this.  She tolerated this well was extubated and transferred recovery.  I did draw a CBC with a differential as well as a CMP for use tomorrow.

## 2024-03-19 NOTE — Anesthesia Preprocedure Evaluation (Signed)
 Anesthesia Evaluation  Patient identified by MRN, date of birth, ID band Patient awake    Reviewed: Allergy & Precautions, NPO status , Patient's Chart, lab work & pertinent test results  Airway Mallampati: II  TM Distance: >3 FB Neck ROM: Full    Dental   Pulmonary neg pulmonary ROS   breath sounds clear to auscultation       Cardiovascular negative cardio ROS  Rhythm:Regular Rate:Normal     Neuro/Psych negative neurological ROS     GI/Hepatic Neg liver ROS,GERD  ,,  Endo/Other  negative endocrine ROS    Renal/GU negative Renal ROS     Musculoskeletal   Abdominal   Peds  Hematology negative hematology ROS (+)   Anesthesia Other Findings   Reproductive/Obstetrics                              Anesthesia Physical Anesthesia Plan  ASA: 2  Anesthesia Plan: General   Post-op Pain Management: Tylenol PO (pre-op)* and Toradol IV (intra-op)*   Induction:   PONV Risk Score and Plan: 3 and Dexamethasone, Ondansetron, Midazolam and Treatment may vary due to age or medical condition  Airway Management Planned: LMA  Additional Equipment:   Intra-op Plan:   Post-operative Plan: Extubation in OR  Informed Consent: I have reviewed the patients History and Physical, chart, labs and discussed the procedure including the risks, benefits and alternatives for the proposed anesthesia with the patient or authorized representative who has indicated his/her understanding and acceptance.     Dental advisory given  Plan Discussed with: CRNA  Anesthesia Plan Comments:         Anesthesia Quick Evaluation

## 2024-03-19 NOTE — Anesthesia Procedure Notes (Signed)
 Procedure Name: LMA Insertion Date/Time: 03/19/2024 11:18 AM  Performed by: Pam Macario BROCKS, CRNAPre-anesthesia Checklist: Patient identified, Emergency Drugs available, Suction available, Patient being monitored and Timeout performed Patient Re-evaluated:Patient Re-evaluated prior to induction Oxygen Delivery Method: Circle system utilized Preoxygenation: Pre-oxygenation with 100% oxygen Induction Type: IV induction Ventilation: Mask ventilation without difficulty LMA: LMA inserted LMA Size: 4.0 Number of attempts: 1 Airway Equipment and Method: Bite block Placement Confirmation: positive ETCO2, breath sounds checked- equal and bilateral and CO2 detector Tube secured with: Tape Dental Injury: Teeth and Oropharynx as per pre-operative assessment

## 2024-03-19 NOTE — Transfer of Care (Signed)
 Immediate Anesthesia Transfer of Care Note  Patient: Dana Baxter  Procedure(s) Performed: INSERTION TUNNELED CENTRAL VENOUS DEVICE PORT LEFT SUBCLAVIAN WITH ULTRASOUND GUIDANCE (Chest)  Patient Location: PACU  Anesthesia Type:General  Level of Consciousness: awake and alert   Airway & Oxygen Therapy: Patient Spontanous Breathing and Patient connected to face mask oxygen  Post-op Assessment: Report given to RN and Post -op Vital signs reviewed and stable  Post vital signs: Reviewed and stable  Last Vitals:  Vitals Value Taken Time  BP 109/68 03/19/24 12:15  Temp    Pulse 96 03/19/24 12:17  Resp 16 03/19/24 12:17  SpO2 99 % 03/19/24 12:17  Vitals shown include unfiled device data.  Last Pain:  Vitals:   03/19/24 1011  TempSrc: Temporal  PainSc: 0-No pain         Complications: No notable events documented.

## 2024-03-19 NOTE — Anesthesia Postprocedure Evaluation (Signed)
 Anesthesia Post Note  Patient: Dana Baxter  Procedure(s) Performed: INSERTION TUNNELED CENTRAL VENOUS DEVICE PORT LEFT SUBCLAVIAN WITH ULTRASOUND GUIDANCE (Chest)     Patient location during evaluation: PACU Anesthesia Type: General Level of consciousness: awake and alert Pain management: pain level controlled Vital Signs Assessment: post-procedure vital signs reviewed and stable Respiratory status: spontaneous breathing, nonlabored ventilation, respiratory function stable and patient connected to nasal cannula oxygen Cardiovascular status: blood pressure returned to baseline and stable Postop Assessment: no apparent nausea or vomiting Anesthetic complications: no   No notable events documented.  Last Vitals:  Vitals:   03/19/24 1245 03/19/24 1300  BP: 109/73 106/68  Pulse: 85 87  Resp: 19 16  Temp:  36.6 C  SpO2: 94% 95%    Last Pain:  Vitals:   03/19/24 1300  TempSrc: Temporal  PainSc:                  Epifanio Lamar BRAVO

## 2024-03-20 ENCOUNTER — Inpatient Hospital Stay

## 2024-03-20 ENCOUNTER — Encounter (HOSPITAL_BASED_OUTPATIENT_CLINIC_OR_DEPARTMENT_OTHER): Payer: Self-pay | Admitting: General Surgery

## 2024-03-20 ENCOUNTER — Inpatient Hospital Stay (HOSPITAL_BASED_OUTPATIENT_CLINIC_OR_DEPARTMENT_OTHER): Admitting: Hematology and Oncology

## 2024-03-20 VITALS — BP 104/68 | HR 77 | Temp 98.6°F | Resp 16

## 2024-03-20 VITALS — BP 118/73 | HR 77 | Temp 98.2°F | Resp 18 | Ht 61.0 in | Wt 117.7 lb

## 2024-03-20 DIAGNOSIS — C50611 Malignant neoplasm of axillary tail of right female breast: Secondary | ICD-10-CM | POA: Diagnosis not present

## 2024-03-20 DIAGNOSIS — Z170 Estrogen receptor positive status [ER+]: Secondary | ICD-10-CM | POA: Diagnosis not present

## 2024-03-20 DIAGNOSIS — Z5111 Encounter for antineoplastic chemotherapy: Secondary | ICD-10-CM | POA: Diagnosis not present

## 2024-03-20 MED ORDER — APREPITANT 130 MG/18ML IV EMUL
130.0000 mg | Freq: Once | INTRAVENOUS | Status: AC
  Administered 2024-03-20: 130 mg via INTRAVENOUS
  Filled 2024-03-20: qty 18

## 2024-03-20 MED ORDER — SODIUM CHLORIDE 0.9 % IV SOLN
540.0000 mg | Freq: Once | INTRAVENOUS | Status: AC
  Administered 2024-03-20: 540 mg via INTRAVENOUS
  Filled 2024-03-20: qty 54

## 2024-03-20 MED ORDER — SODIUM CHLORIDE 0.9 % IV SOLN
420.0000 mg | Freq: Once | INTRAVENOUS | Status: AC
  Administered 2024-03-20: 420 mg via INTRAVENOUS
  Filled 2024-03-20: qty 14

## 2024-03-20 MED ORDER — TRASTUZUMAB-ANNS CHEMO 150 MG IV SOLR
8.0000 mg/kg | Freq: Once | INTRAVENOUS | Status: AC
  Administered 2024-03-20: 420 mg via INTRAVENOUS
  Filled 2024-03-20: qty 20

## 2024-03-20 MED ORDER — DEXAMETHASONE SOD PHOSPHATE PF 10 MG/ML IJ SOLN
10.0000 mg | Freq: Once | INTRAMUSCULAR | Status: AC
  Administered 2024-03-20: 10 mg via INTRAVENOUS

## 2024-03-20 MED ORDER — PALONOSETRON HCL INJECTION 0.25 MG/5ML
0.2500 mg | Freq: Once | INTRAVENOUS | Status: AC
  Administered 2024-03-20: 0.25 mg via INTRAVENOUS
  Filled 2024-03-20: qty 5

## 2024-03-20 MED ORDER — SODIUM CHLORIDE 0.9 % IV SOLN
75.0000 mg/m2 | Freq: Once | INTRAVENOUS | Status: AC
  Administered 2024-03-20: 113 mg via INTRAVENOUS
  Filled 2024-03-20: qty 11.3

## 2024-03-20 MED ORDER — ACETAMINOPHEN 325 MG PO TABS
650.0000 mg | ORAL_TABLET | Freq: Once | ORAL | Status: AC
  Administered 2024-03-20: 650 mg via ORAL
  Filled 2024-03-20: qty 2

## 2024-03-20 MED ORDER — SODIUM CHLORIDE 0.9 % IV SOLN: INTRAVENOUS | Status: DC

## 2024-03-20 MED ORDER — DIPHENHYDRAMINE HCL 25 MG PO CAPS
25.0000 mg | ORAL_CAPSULE | Freq: Once | ORAL | Status: AC
  Administered 2024-03-20: 25 mg via ORAL
  Filled 2024-03-20: qty 1

## 2024-03-20 NOTE — Progress Notes (Signed)
 [facility] CSW Progress Note  Clinical Social Work Intern met with patient in infusion suite to follow-up on assessment of psychosocial needs per distress screen protocol. Pt and Intern phone call the week before was cut short- Intern went to introduce herself in person and complete initial assessment. Today it pt's first infusion treatment. Pt reports she is feeling well so far and in good spirits overall, though she is experiencing continued anxiety off and on.    Interventions: Discuss common feeling and emotions when being diagnosed with cancer, and the importance of support during treatment Inform patient of the support team roles and support services at [facility] Provided CSW contact information and encourage patient to call with any questions or concerns      Follow Up Plan:  Patient will contact CSW with any support or resource needs    [name] Clinical Social Worker [facility]

## 2024-03-20 NOTE — Patient Instructions (Signed)
 [facility] - A DEPT OF [facility]  Discharge Instructions: Thank you for choosing [facility] to provide your oncology and hematology care.   If you have a lab appointment with the Cancer Center, please go directly to the Cancer Center and check in at the registration area.   Wear comfortable clothing and clothing appropriate for easy access to any Portacath or PICC line.   We strive to give you quality time with your provider. You may need to reschedule your appointment if you arrive late (15 or more minutes).  Arriving late affects you and other patients whose appointments are after yours.  Also, if you miss three or more appointments without notifying the office, you may be dismissed from the clinic at the provider's discretion.      For prescription refill requests, have your pharmacy contact our office and allow 72 hours for refills to be completed.    Today you received the following chemotherapy and/or immunotherapy agents Kanjinti, Perjeta, Docetaxel, Carboplatin      To help prevent nausea and vomiting after your treatment, we encourage you to take your nausea medication as directed.  BELOW ARE SYMPTOMS THAT SHOULD BE REPORTED IMMEDIATELY: *FEVER GREATER THAN 100.4 F (38 C) OR HIGHER *CHILLS OR SWEATING *NAUSEA AND VOMITING THAT IS NOT CONTROLLED WITH YOUR NAUSEA MEDICATION *UNUSUAL SHORTNESS OF BREATH *UNUSUAL BRUISING OR BLEEDING *URINARY PROBLEMS (pain or burning when urinating, or frequent urination) *BOWEL PROBLEMS (unusual diarrhea, constipation, pain near the anus) TENDERNESS IN MOUTH AND THROAT WITH OR WITHOUT PRESENCE OF ULCERS (sore throat, sores in mouth, or a toothache) UNUSUAL RASH, SWELLING OR PAIN  UNUSUAL VAGINAL DISCHARGE OR ITCHING   Items with * indicate a potential emergency and should be followed up as soon as possible or go to the Emergency Department if any problems should occur.  Please show the  CHEMOTHERAPY ALERT CARD or IMMUNOTHERAPY ALERT CARD at check-in to the Emergency Department and triage nurse.  Should you have questions after your visit or need to cancel or reschedule your appointment, please contact [facility] - A DEPT OF [facility]  Dept: [phone]  and follow the prompts.  Office hours are 8:00 a.m. to 4:30 p.m. Monday - Friday. Please note that voicemails left after 4:00 p.m. may not be returned until the following business day.  We are closed weekends and major holidays. You have access to a nurse at all times for urgent questions. Please call the main number to the clinic Dept: [phone] and follow the prompts.   For any non-urgent questions, you may also contact your provider using MyChart. We now offer e-Visits for anyone 47 and older to request care online for non-urgent symptoms. For details visit [email or web address].   Also download the MyChart app! Go to the app store, search MyChart, open the app, select [facility], and log in with your MyChart username and password.

## 2024-03-20 NOTE — Assessment & Plan Note (Signed)
 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  Axillary tail mass ER 40%, PR 30%, HER2 positive, Ki-67 50%   Recommendation based on multidisciplinary tumor board: 1. Neoadjuvant chemotherapy with TCH Perjeta 6 cycles followed by Herceptin Perjeta maintenance versus Kadcyla maintenance (based on response to neoadjuvant chemo) for 1 year 2. Followed by breast conserving surgeries with targeted lymph node dissection 3. Followed by adjuvant radiation therapy if patient had lumpectomy 4.  Antiestrogen therapy based on final pathology --------------------------------------------------------------------------------------------------------------- 03/13/24:Breast MRI: Rt Breast mass 2.4 cm (2 masses 1.8 cm and 1.4 cm), 0.5 cm, LN 2.5 cm, 1.3 cm and several other LN 03/15/24: Rt breast primary with rt axillary, sub pectoral and Int mamm nodes, 7 mm Rt hepatic hemangioma, ? Colitis and 4 mm Lt lung nodule, Bone scan Neg ECHO: EF 55-60%  Current treatment: Cycle 1 TCHP   Antiemetics were reviewed Chemotherapy consent obtained Chemotherapy education completed   Return to clinic in one week for toxicity check

## 2024-03-20 NOTE — Progress Notes (Addendum)
 Patient Care Team: Prentiss Spanner, MD as PCP - General Gerome, Devere HERO, RN as Oncology Nurse Navigator Tyree Nanetta SAILOR, RN as Oncology Nurse Navigator Ebbie Cough, MD as Consulting Physician (General Surgery) Odean Potts, MD as Consulting Physician (Hematology and Oncology) Shannon Agent, MD as Consulting Physician (Radiation Oncology)  DIAGNOSIS:  Encounter Diagnosis  Name Primary?   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  Axillary tail mass ER 40%, PR 30%, HER2 positive, Ki-67 50%   03/06/2024 Cancer Staging   Staging form: Breast, AJCC 8th Edition - Clinical: Stage IIB (cT2, cN1, cM0, G2, ER-, PR-, HER2+) - Signed by Odean Potts, MD on 03/06/2024 Stage prefix: Initial diagnosis Histologic grading system: 3 grade system   03/20/2024 -  Chemotherapy   Patient is on Treatment Plan : BREAST  Docetaxel + Carboplatin + Trastuzumab + Pertuzumab  (TCHP) q21d        CHIEF COMPLIANT: Cycle 1 TCHP  HISTORY OF PRESENT ILLNESS: Bari is a 53 year old with above-mentioned history of HER2 positive breast cancer who is here for to receive her first cycle of chemotherapy with TCH Perjeta.  She has completed her chemo education class and has taken her Decadron.  The port has been placed  and is markedly uncomfortable for now.  History of Present Illness      ALLERGIES:  is allergic to penicillins, sulfa antibiotics, metronidazole , and morphine.  MEDICATIONS:  Current Outpatient Medications  Medication Sig Dispense Refill   ondansetron (ZOFRAN) 8 MG tablet Take 1 tablet (8 mg total) by mouth every 8 (eight) hours as needed for nausea or vomiting. Start on the third day after chemotherapy. 30 tablet 1   prochlorperazine (COMPAZINE) 10 MG tablet Take 1 tablet (10 mg total) by mouth every 6 (six) hours as needed for nausea or vomiting. 30 tablet 1   dexamethasone (DECADRON) 4 MG tablet Take 1 tablet day before chemo and 1 tablet day after chemo with food (Patient not taking: Reported on 03/20/2024) 30 tablet 1   lidocaine-prilocaine (EMLA) cream Apply to affected area once (Patient not taking: Reported on 03/20/2024) 30 g 3   omeprazole (PRILOSEC OTC) 20 MG tablet Take 20 mg by mouth daily. (Patient not taking: Reported on 03/20/2024)     No current facility-administered medications for this visit.    PHYSICAL EXAMINATION: ECOG PERFORMANCE STATUS: 1 - Symptomatic but completely ambulatory  Vitals:   03/20/24 0805  BP: 118/73  Pulse: 77  Resp: 18  Temp: 98.2 F (36.8 C)  SpO2: 100%   Filed Weights   03/20/24 0805  Weight: 117 lb 11.2 oz (53.4 kg)      LABORATORY DATA:  I have reviewed the data as listed    Latest Ref Rng & Units 03/06/2024   12:31 PM 02/28/2019   11:59 AM  CMP  Glucose 70 - 99 mg/dL  99  89   BUN 6 - 20 mg/dL 17  12   Creatinine 9.55 - 1.00 mg/dL 9.17  9.17   Sodium 864 - 145 mmol/L 138  139   Potassium 3.5 - 5.1 mmol/L 3.9  4.2   Chloride 98 - 111 mmol/L 103  103   CO2 22 - 32 mmol/L 30  22   Calcium 8.9 - 10.3 mg/dL 9.2  9.4   Total Protein 6.5 - 8.1 g/dL 6.4  6.4   Total Bilirubin 0.0 - 1.2 mg/dL 0.4  0.3   Alkaline Phos 38 - 126 U/L 56  68   AST 15 - 41 U/L 16  15   ALT 0 - 44 U/L 21  10     Lab Results  Component Value Date    WBC 6.0 03/06/2024   HGB 12.5 03/06/2024   HCT 36.6 03/06/2024   MCV 97.1 03/06/2024   PLT 256 03/06/2024   NEUTROABS 3.3 03/06/2024    ASSESSMENT & PLAN:  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  Axillary tail mass ER 40%, PR 30%, HER2 positive, Ki-67 50%   Recommendation based on multidisciplinary tumor board: 1. Neoadjuvant chemotherapy with TCH Perjeta 6 cycles followed by Herceptin Perjeta maintenance versus Kadcyla maintenance (based on response to neoadjuvant chemo) for 1 year 2. Followed by breast conserving surgeries with targeted lymph node dissection 3. Followed by adjuvant radiation therapy if patient had lumpectomy 4.  Antiestrogen therapy based on final pathology --------------------------------------------------------------------------------------------------------------- 03/13/24:Breast MRI: Rt Breast mass 2.4 cm (2 masses 1.8 cm and 1.4 cm), 0.5 cm, LN 2.5 cm, 1.3 cm and several other LN 03/15/24: Rt breast primary with rt axillary, sub pectoral and Int mamm nodes, 7 mm Rt hepatic hemangioma, ? Colitis and 4 mm Lt lung nodule, Bone scan Neg ECHO: EF 55-60%  Current treatment: Cycle 1 TCHP   Antiemetics were reviewed Chemotherapy consent obtained Chemotherapy education completed   Return to clinic in one week for toxicity check      No orders of the defined types were placed in this encounter.  The patient has a good understanding of the overall plan. she agrees with it. she will call with any problems that may develop before the next visit here.  I personally spent a total of 30 minutes in the care of the patient today including preparing to see the patient, getting/reviewing  separately obtained history, performing a medically appropriate exam/evaluation, counseling and educating, placing orders, referring and communicating with other health care professionals, documenting clinical information in the EHR, independently interpreting results, communicating results, and coordinating care.   Viinay K Anavictoria Wilk, MD 03/20/24  Addendum: Genna to proceed with treatment based on labs done on 03/06/2024

## 2024-03-22 ENCOUNTER — Inpatient Hospital Stay

## 2024-03-22 VITALS — BP 122/85 | HR 70 | Temp 98.3°F | Resp 17

## 2024-03-22 DIAGNOSIS — Z170 Estrogen receptor positive status [ER+]: Secondary | ICD-10-CM

## 2024-03-22 DIAGNOSIS — Z5111 Encounter for antineoplastic chemotherapy: Secondary | ICD-10-CM | POA: Diagnosis not present

## 2024-03-22 MED ORDER — PEGFILGRASTIM-JMDB 6 MG/0.6ML ~~LOC~~ SOSY
6.0000 mg | PREFILLED_SYRINGE | Freq: Once | SUBCUTANEOUS | Status: AC
  Administered 2024-03-22: 6 mg via SUBCUTANEOUS
  Filled 2024-03-22: qty 0.6

## 2024-03-27 ENCOUNTER — Encounter: Payer: Self-pay | Admitting: *Deleted

## 2024-03-27 ENCOUNTER — Encounter: Payer: Self-pay | Admitting: Hematology and Oncology

## 2024-03-27 ENCOUNTER — Inpatient Hospital Stay: Attending: Hematology and Oncology

## 2024-03-27 ENCOUNTER — Inpatient Hospital Stay: Admitting: Hematology and Oncology

## 2024-03-27 DIAGNOSIS — Z171 Estrogen receptor negative status [ER-]: Secondary | ICD-10-CM | POA: Insufficient documentation

## 2024-03-27 DIAGNOSIS — Z5111 Encounter for antineoplastic chemotherapy: Secondary | ICD-10-CM | POA: Diagnosis present

## 2024-03-27 DIAGNOSIS — R600 Localized edema: Secondary | ICD-10-CM | POA: Insufficient documentation

## 2024-03-27 DIAGNOSIS — Z170 Estrogen receptor positive status [ER+]: Secondary | ICD-10-CM

## 2024-03-27 DIAGNOSIS — L299 Pruritus, unspecified: Secondary | ICD-10-CM | POA: Diagnosis not present

## 2024-03-27 DIAGNOSIS — G4700 Insomnia, unspecified: Secondary | ICD-10-CM | POA: Diagnosis not present

## 2024-03-27 DIAGNOSIS — Z5189 Encounter for other specified aftercare: Secondary | ICD-10-CM | POA: Insufficient documentation

## 2024-03-27 DIAGNOSIS — R61 Generalized hyperhidrosis: Secondary | ICD-10-CM | POA: Diagnosis not present

## 2024-03-27 DIAGNOSIS — Z1722 Progesterone receptor negative status: Secondary | ICD-10-CM | POA: Insufficient documentation

## 2024-03-27 DIAGNOSIS — R21 Rash and other nonspecific skin eruption: Secondary | ICD-10-CM | POA: Diagnosis not present

## 2024-03-27 DIAGNOSIS — R432 Parageusia: Secondary | ICD-10-CM | POA: Diagnosis not present

## 2024-03-27 DIAGNOSIS — T451X5A Adverse effect of antineoplastic and immunosuppressive drugs, initial encounter: Secondary | ICD-10-CM | POA: Insufficient documentation

## 2024-03-27 DIAGNOSIS — C50611 Malignant neoplasm of axillary tail of right female breast: Secondary | ICD-10-CM | POA: Diagnosis not present

## 2024-03-27 DIAGNOSIS — C50411 Malignant neoplasm of upper-outer quadrant of right female breast: Secondary | ICD-10-CM | POA: Diagnosis not present

## 2024-03-27 DIAGNOSIS — D701 Agranulocytosis secondary to cancer chemotherapy: Secondary | ICD-10-CM | POA: Insufficient documentation

## 2024-03-27 DIAGNOSIS — Z79899 Other long term (current) drug therapy: Secondary | ICD-10-CM | POA: Insufficient documentation

## 2024-03-27 DIAGNOSIS — R197 Diarrhea, unspecified: Secondary | ICD-10-CM | POA: Insufficient documentation

## 2024-03-27 DIAGNOSIS — K1230 Oral mucositis (ulcerative), unspecified: Secondary | ICD-10-CM | POA: Insufficient documentation

## 2024-03-27 DIAGNOSIS — Z1731 Human epidermal growth factor receptor 2 positive status: Secondary | ICD-10-CM | POA: Insufficient documentation

## 2024-03-27 LAB — CBC WITH DIFFERENTIAL (CANCER CENTER ONLY)
Abs Immature Granulocytes: 0.01 K/uL (ref 0.00–0.07)
Basophils Absolute: 0 K/uL (ref 0.0–0.1)
Basophils Relative: 0 %
Eosinophils Absolute: 0 K/uL (ref 0.0–0.5)
Eosinophils Relative: 0 %
HCT: 36.9 % (ref 36.0–46.0)
Hemoglobin: 12.5 g/dL (ref 12.0–15.0)
Immature Granulocytes: 0 %
Lymphocytes Relative: 60 %
Lymphs Abs: 1.3 K/uL (ref 0.7–4.0)
MCH: 32.9 pg (ref 26.0–34.0)
MCHC: 33.9 g/dL (ref 30.0–36.0)
MCV: 97.1 fL (ref 80.0–100.0)
Monocytes Absolute: 0.5 K/uL (ref 0.1–1.0)
Monocytes Relative: 22 %
Neutro Abs: 0.4 K/uL — CL (ref 1.7–7.7)
Neutrophils Relative %: 18 %
Platelet Count: 183 K/uL (ref 150–400)
RBC: 3.8 MIL/uL — ABNORMAL LOW (ref 3.87–5.11)
RDW: 12.5 % (ref 11.5–15.5)
Smear Review: NORMAL
WBC Count: 2.3 K/uL — ABNORMAL LOW (ref 4.0–10.5)
nRBC: 0 % (ref 0.0–0.2)

## 2024-03-27 LAB — CMP (CANCER CENTER ONLY)
ALT: 23 U/L (ref 0–44)
AST: 13 U/L — ABNORMAL LOW (ref 15–41)
Albumin: 3.7 g/dL (ref 3.5–5.0)
Alkaline Phosphatase: 60 U/L (ref 38–126)
Anion gap: 4 — ABNORMAL LOW (ref 5–15)
BUN: 14 mg/dL (ref 6–20)
CO2: 28 mmol/L (ref 22–32)
Calcium: 8.5 mg/dL — ABNORMAL LOW (ref 8.9–10.3)
Chloride: 101 mmol/L (ref 98–111)
Creatinine: 0.71 mg/dL (ref 0.44–1.00)
GFR, Estimated: >60 mL/min (ref >60)
Glucose, Bld: 109 mg/dL — ABNORMAL HIGH (ref 70–99)
Potassium: 3.6 mmol/L (ref 3.5–5.1)
Sodium: 133 mmol/L — ABNORMAL LOW (ref 135–145)
Total Bilirubin: 0.2 mg/dL (ref 0.0–1.2)
Total Protein: 6 g/dL — ABNORMAL LOW (ref 6.5–8.1)

## 2024-03-27 NOTE — Assessment & Plan Note (Signed)
 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  Axillary tail mass ER 40%, PR 30%, HER2 positive, Ki-67 50%    Recommendation based on multidisciplinary tumor board: 1. Neoadjuvant chemotherapy with TCH Perjeta 6 cycles followed by Herceptin Perjeta maintenance versus Kadcyla maintenance (based on response to neoadjuvant chemo) for 1 year 2. Followed by breast conserving surgeries with targeted lymph node dissection 3. Followed by adjuvant radiation therapy if patient had lumpectomy 4.  Antiestrogen therapy based on final pathology --------------------------------------------------------------------------------------------------------------- 03/13/24:Breast MRI: Rt Breast mass 2.4 cm (2 masses 1.8 cm and 1.4 cm), 0.5 cm, LN 2.5 cm, 1.3 cm and several other LN 03/15/24: Rt breast primary with rt axillary, sub pectoral and Int mamm nodes, 7 mm Rt hepatic hemangioma, ? Colitis and 4 mm Lt lung nodule, Bone scan Neg ECHO: EF 55-60%   Current treatment: Cycle 1 day 8 TCHP   Chemo toxicities:  Return to clinic in 2 weeks for cycle 2

## 2024-03-27 NOTE — Progress Notes (Signed)
 CRITICAL VALUE STICKER  CRITICAL VALUE: ANC 0.4  RECEIVER (on-site recipient of call): Larraine, RN  DATE & TIME NOTIFIED: 03/27/24 at 1146  MESSENGER (representative from lab): Powell  MD NOTIFIED: Mackey Chad, MD  TIME OF NOTIFICATION: 03/27/24 at 1148  RESPONSE:  MD notified and verbalized understanding.

## 2024-03-27 NOTE — Progress Notes (Signed)
 Patient Care Team: Prentiss Spanner, MD as PCP - General Gerome, Devere HERO, RN as Oncology Nurse Navigator Tyree Nanetta SAILOR, RN as Oncology Nurse Navigator Ebbie Cough, MD as Consulting Physician (General Surgery) Odean Potts, MD as Consulting Physician (Hematology and Oncology) Shannon Agent, MD as Consulting Physician (Radiation Oncology)  DIAGNOSIS:  Encounter Diagnosis  Name Primary?   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  Axillary tail mass ER 40%, PR 30%, HER2 positive, Ki-67 50%   03/06/2024 Cancer Staging   Staging form: Breast, AJCC 8th Edition - Clinical: Stage IIB (cT2, cN1, cM0, G2, ER-, PR-, HER2+) - Signed by Odean Potts, MD on 03/06/2024 Stage prefix: Initial diagnosis Histologic grading system: 3 grade system   03/20/2024 -  Chemotherapy   Patient is on Treatment Plan : BREAST  Docetaxel + Carboplatin + Trastuzumab + Pertuzumab  (TCHP) q21d        CHIEF COMPLIANT: Cycle 1 day TCHP  HISTORY OF PRESENT ILLNESS:  History of Present Illness Dana Baxter is a 53 year old female undergoing chemotherapy who presents for follow-up of treatment-related side effects.  She experiences fatigue following chemotherapy, with increased tiredness after clinic visits.  Dizziness and visual disturbances, described as black spots, occur during household chores and at work. She notes low blood pressure and slight diarrhea but maintains hydration.  Oral discomfort includes sores and a sore throat, with a change in taste causing throat irritation. She develops a rash and itching on her back, managed with Benadryl. Fluid retention with foot swelling resolves by the next day.  She experiences poor sleep with frequent awakenings and profuse sweating. Neuropathy symptoms include tingling in her fingers, managed with compression socks, gloves, and lotion. She uses nausea medication as a precaution and salt water rinses for mouth sores.     ALLERGIES:  is allergic to penicillins, sulfa antibiotics, metronidazole , and morphine.  MEDICATIONS:  Current Outpatient Medications  Medication Sig Dispense Refill   ondansetron (ZOFRAN) 8 MG tablet Take 1 tablet (8 mg total) by mouth every 8 (eight) hours as needed for nausea or vomiting. Start on the third day after chemotherapy. 30 tablet 1   prochlorperazine (COMPAZINE) 10 MG tablet Take 1 tablet (10 mg total) by mouth every 6 (six) hours as needed for nausea or vomiting. 30 tablet 1   dexamethasone (DECADRON) 4 MG tablet Take 1 tablet day before chemo and 1 tablet day after chemo with food (Patient not taking: Reported on 03/27/2024) 30 tablet 1   lidocaine-prilocaine (EMLA) cream Apply to affected area once (Patient not taking: Reported on 03/27/2024) 30 g 3   omeprazole (PRILOSEC OTC) 20 MG tablet Take 20 mg by mouth daily. (Patient not taking: Reported on 03/27/2024)     No current facility-administered medications for this visit.    PHYSICAL EXAMINATION: ECOG PERFORMANCE STATUS: 1 - Symptomatic but  completely ambulatory    Physical Exam SKIN: No shingles or other concerning lesions. Rash present, likely drug reaction.  (exam performed in the presence of a chaperone)  LABORATORY DATA:  I have reviewed the data as  listed    Latest Ref Rng & Units 03/27/2024   10:41 AM 03/06/2024   12:31 PM 02/28/2019   11:59 AM  CMP  Glucose 70 - 99 mg/dL 890  99  89   BUN 6 - 20 mg/dL 14  17  12    Creatinine 0.44 - 1.00 mg/dL 9.28  9.17  9.17   Sodium 135 - 145 mmol/L 133  138  139   Potassium 3.5 - 5.1 mmol/L 3.6  3.9  4.2   Chloride 98 - 111 mmol/L 101  103  103   CO2 22 - 32 mmol/L 28  30  22    Calcium 8.9 - 10.3 mg/dL 8.5  9.2  9.4   Total Protein 6.5 - 8.1 g/dL 6.0  6.4  6.4   Total Bilirubin 0.0 - 1.2 mg/dL 0.2  0.4  0.3   Alkaline Phos 38 - 126 U/L 60  56  68   AST 15 - 41 U/L 13  16  15    ALT 0 - 44 U/L 23  21  10      Lab Results  Component Value Date   WBC 2.3 (L) 03/27/2024   HGB 12.5 03/27/2024   HCT 36.9 03/27/2024   MCV 97.1 03/27/2024   PLT 183 03/27/2024   NEUTROABS 0.4 (LL) 03/27/2024    ASSESSMENT & PLAN:  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  Axillary tail mass ER 40%, PR 30%, HER2 positive, Ki-67 50%    Recommendation based on multidisciplinary tumor board: 1. Neoadjuvant chemotherapy with TCH Perjeta 6 cycles followed by Herceptin Perjeta maintenance versus Kadcyla maintenance (based on response to neoadjuvant chemo) for 1 year 2. Followed by breast conserving surgeries with targeted lymph node dissection 3. Followed by adjuvant radiation therapy if patient had lumpectomy 4.  Antiestrogen therapy based on final pathology --------------------------------------------------------------------------------------------------------------- 03/13/24:Breast MRI: Rt Breast mass 2.4 cm (2 masses 1.8 cm and 1.4 cm), 0.5 cm, LN 2.5 cm, 1.3 cm and several other LN 03/15/24: Rt breast primary with rt axillary,  sub pectoral and Int mamm nodes, 7 mm Rt hepatic hemangioma, ? Colitis and 4 mm Lt lung nodule, Bone scan Neg ECHO: EF 55-60%   Current treatment: Cycle 1 day 8 TCHP   Chemo toxicities: Mouth sores Fatigue Mild diarrhea Low neutrophil count: I will reduce the dosage of next chemotherapy.  Denied nausea vomiting or bone pain  Return to clinic in 2 weeks for cycle 2  ------------------------------------- Assessment and Plan Assessment & Plan HER2 positive breast cancer, axillary tail of right breast, undergoing chemotherapy Experiencing significant chemotherapy side effects including dizziness, nausea, diarrhea, oral mucositis, dysgeusia, insomnia, night sweats, rash, peripheral neuropathy, and localized edema. Symptoms expected to improve, though fatigue and rash may worsen. Hair loss anticipated after second cycle. - Continue current chemotherapy regimen. - Monitor symptoms and adjust treatment as necessary.  Chemotherapy-induced neutropenia Neutrophil count expected to be low due to chemotherapy. Preliminary results show decreased white blood cell count. Neutrophil count pending; dosage reduction if below  0.5. - Monitor neutrophil count and adjust chemotherapy dosage if necessary.  Chemotherapy-induced fatigue Experiencing fatigue, expected to increase with treatment cycles.  Chemotherapy-induced nausea and diarrhea Nausea and diarrhea present but manageable with current medications. - Continue current anti-nausea medication as needed. - Monitor diarrhea and manage symptoms.  Chemotherapy-induced oral mucositis and dysgeusia Experiencing oral mucositis and dysgeusia with mouth sores and altered taste. Symptoms expected to improve. - Use salt water rinses for oral mucositis. - Monitor symptoms and provide supportive care.  Chemotherapy-induced insomnia and night sweats Experiencing insomnia and night sweats, likely chemotherapy-related. May improve over time. - Monitor  symptoms and provide supportive care.  Chemotherapy-induced rash Developed rash, likely drug reaction to Taxotere. Managed with Benadryl and cortisone cream. - Continue Benadryl and cortisone cream for rash management. - Monitor rash for changes or worsening.  Chemotherapy-induced peripheral neuropathy Experiencing mild peripheral neuropathy with tingling and numbness. Symptoms manageable with lotion. - Continue using lotion for symptom relief. - Monitor for worsening symptoms.  Chemotherapy-induced localized edema Experiencing localized edema, likely due to fluid retention from chemotherapy.      No orders of the defined types were placed in this encounter.  The patient has a good understanding of the overall plan. she agrees with it. she will call with any problems that may develop before the next visit here.  I personally spent a total of 30 minutes in the care of the patient today including preparing to see the patient, getting/reviewing separately obtained history, performing a medically appropriate exam/evaluation, counseling and educating, placing orders, referring and communicating with other health care professionals, documenting clinical information in the EHR, independently interpreting results, communicating results, and coordinating care.   Viinay K Bonny Vanleeuwen, MD 03/27/24

## 2024-04-01 ENCOUNTER — Telehealth: Payer: Self-pay

## 2024-04-01 NOTE — Telephone Encounter (Signed)
 Pt called and reports she has pain to her right lower eye lid, stating there is a small knot about the size of a small pea directly below her lash line. She is concerned about what this may be.  Advised pt this sounds like it could be a stye. She was encouraged to stop wearing make up until healed and apply warm compress. She states she does not have a PCP and while she has an eye Dr, visits are inconsistent. If sx worsen or do not improve in a couple of days, she will call us  and we can see about getting her in with [facility].

## 2024-04-04 ENCOUNTER — Telehealth: Payer: Self-pay | Admitting: *Deleted

## 2024-04-04 NOTE — Telephone Encounter (Signed)
 Received call from pt stating she continues to experience a hard know behind her right lower eyelid.  Pt states she has applied a warm compress x 4 days with no relief .  Pt also states she has a small area of swelling on her under eye area near her cheekbone. Pt denies any eye drainage, redness or vision change at this time. Pt educated to continue to apply warm compress and notify our office of any changes.  Pt verbalized understanding.

## 2024-04-10 ENCOUNTER — Encounter: Payer: Self-pay | Admitting: Adult Health

## 2024-04-10 ENCOUNTER — Encounter: Payer: Self-pay | Admitting: Hematology and Oncology

## 2024-04-10 ENCOUNTER — Inpatient Hospital Stay

## 2024-04-10 ENCOUNTER — Other Ambulatory Visit (HOSPITAL_COMMUNITY): Payer: Self-pay

## 2024-04-10 ENCOUNTER — Inpatient Hospital Stay: Admitting: Adult Health

## 2024-04-10 VITALS — BP 111/71 | HR 55 | Temp 98.4°F | Resp 18 | Ht 61.0 in | Wt 122.4 lb

## 2024-04-10 DIAGNOSIS — C50611 Malignant neoplasm of axillary tail of right female breast: Secondary | ICD-10-CM

## 2024-04-10 DIAGNOSIS — R600 Localized edema: Secondary | ICD-10-CM | POA: Diagnosis not present

## 2024-04-10 DIAGNOSIS — Z170 Estrogen receptor positive status [ER+]: Secondary | ICD-10-CM | POA: Diagnosis not present

## 2024-04-10 DIAGNOSIS — Z5111 Encounter for antineoplastic chemotherapy: Secondary | ICD-10-CM | POA: Diagnosis not present

## 2024-04-10 LAB — CMP (CANCER CENTER ONLY)
ALT: 46 U/L — ABNORMAL HIGH (ref 0–44)
AST: 29 U/L (ref 15–41)
Albumin: 4 g/dL (ref 3.5–5.0)
Alkaline Phosphatase: 94 U/L (ref 38–126)
Anion gap: 12 (ref 5–15)
BUN: 16 mg/dL (ref 6–20)
CO2: 26 mmol/L (ref 22–32)
Calcium: 9.2 mg/dL (ref 8.9–10.3)
Chloride: 101 mmol/L (ref 98–111)
Creatinine: 0.71 mg/dL (ref 0.44–1.00)
GFR, Estimated: >60 mL/min (ref >60)
Glucose, Bld: 143 mg/dL — ABNORMAL HIGH (ref 70–99)
Potassium: 3.9 mmol/L (ref 3.5–5.1)
Sodium: 138 mmol/L (ref 135–145)
Total Bilirubin: 0.2 mg/dL (ref 0.0–1.2)
Total Protein: 6.1 g/dL — ABNORMAL LOW (ref 6.5–8.1)

## 2024-04-10 LAB — CBC WITH DIFFERENTIAL (CANCER CENTER ONLY)
Abs Immature Granulocytes: 0.06 K/uL (ref 0.00–0.07)
Basophils Absolute: 0 K/uL (ref 0.0–0.1)
Basophils Relative: 0 %
Eosinophils Absolute: 0 K/uL (ref 0.0–0.5)
Eosinophils Relative: 0 %
HCT: 37 % (ref 36.0–46.0)
Hemoglobin: 12.3 g/dL (ref 12.0–15.0)
Immature Granulocytes: 1 %
Lymphocytes Relative: 10 %
Lymphs Abs: 1 K/uL (ref 0.7–4.0)
MCH: 33.4 pg (ref 26.0–34.0)
MCHC: 33.2 g/dL (ref 30.0–36.0)
MCV: 100.5 fL — ABNORMAL HIGH (ref 80.0–100.0)
Monocytes Absolute: 0.5 K/uL (ref 0.1–1.0)
Monocytes Relative: 6 %
Neutro Abs: 7.9 K/uL — ABNORMAL HIGH (ref 1.7–7.7)
Neutrophils Relative %: 83 %
Platelet Count: 328 K/uL (ref 150–400)
RBC: 3.68 MIL/uL — ABNORMAL LOW (ref 3.87–5.11)
RDW: 14.2 % (ref 11.5–15.5)
WBC Count: 9.5 K/uL (ref 4.0–10.5)
nRBC: 0 % (ref 0.0–0.2)

## 2024-04-10 MED ORDER — DIPHENHYDRAMINE HCL 25 MG PO CAPS
25.0000 mg | ORAL_CAPSULE | Freq: Once | ORAL | Status: AC
  Administered 2024-04-10: 25 mg via ORAL
  Filled 2024-04-10: qty 1

## 2024-04-10 MED ORDER — PALONOSETRON HCL INJECTION 0.25 MG/5ML
0.2500 mg | Freq: Once | INTRAVENOUS | Status: AC
  Administered 2024-04-10: 0.25 mg via INTRAVENOUS
  Filled 2024-04-10: qty 5

## 2024-04-10 MED ORDER — SODIUM CHLORIDE 0.9 % IV SOLN
420.0000 mg | Freq: Once | INTRAVENOUS | Status: AC
  Administered 2024-04-10: 420 mg via INTRAVENOUS
  Filled 2024-04-10: qty 14

## 2024-04-10 MED ORDER — DEXAMETHASONE SOD PHOSPHATE PF 10 MG/ML IJ SOLN
10.0000 mg | Freq: Once | INTRAMUSCULAR | Status: AC
  Administered 2024-04-10: 10 mg via INTRAVENOUS

## 2024-04-10 MED ORDER — NYSTATIN 100000 UNIT/ML MT SUSP
5.0000 mL | Freq: Three times a day (TID) | OROMUCOSAL | 0 refills | Status: AC
  Filled 2024-04-10: qty 240, 14d supply, fill #0

## 2024-04-10 MED ORDER — TRASTUZUMAB-ANNS CHEMO 150 MG IV SOLR
6.0000 mg/kg | Freq: Once | INTRAVENOUS | Status: AC
  Administered 2024-04-10: 300 mg via INTRAVENOUS
  Filled 2024-04-10: qty 14.29

## 2024-04-10 MED ORDER — SODIUM CHLORIDE 0.9 % IV SOLN
458.0000 mg | Freq: Once | INTRAVENOUS | Status: AC
  Administered 2024-04-10: 460 mg via INTRAVENOUS
  Filled 2024-04-10: qty 46

## 2024-04-10 MED ORDER — FUROSEMIDE 20 MG PO TABS
20.0000 mg | ORAL_TABLET | Freq: Every day | ORAL | 0 refills | Status: DC | PRN
  Filled 2024-04-10: qty 30, 30d supply, fill #0

## 2024-04-10 MED ORDER — SODIUM CHLORIDE 0.9 % IV SOLN
65.0000 mg/m2 | Freq: Once | INTRAVENOUS | Status: AC
  Administered 2024-04-10: 98 mg via INTRAVENOUS
  Filled 2024-04-10: qty 9.8

## 2024-04-10 MED ORDER — ACETAMINOPHEN 325 MG PO TABS
650.0000 mg | ORAL_TABLET | Freq: Once | ORAL | Status: AC
  Administered 2024-04-10: 650 mg via ORAL
  Filled 2024-04-10: qty 2

## 2024-04-10 MED ORDER — APREPITANT 130 MG/18ML IV EMUL
130.0000 mg | Freq: Once | INTRAVENOUS | Status: AC
  Administered 2024-04-10: 130 mg via INTRAVENOUS
  Filled 2024-04-10: qty 18

## 2024-04-10 MED ORDER — SODIUM CHLORIDE 0.9 % IV SOLN: INTRAVENOUS | Status: DC

## 2024-04-10 NOTE — Patient Instructions (Addendum)
 [facility] - A DEPT OF [facility]  Discharge Instructions: Thank you for choosing [facility] to provide your oncology and hematology care.   If you have a lab appointment with the Cancer Center, please go directly to the Cancer Center and check in at the registration area.   Wear comfortable clothing and clothing appropriate for easy access to any Portacath or PICC line.   We strive to give you quality time with your provider. You may need to reschedule your appointment if you arrive late (15 or more minutes).  Arriving late affects you and other patients whose appointments are after yours.  Also, if you miss three or more appointments without notifying the office, you may be dismissed from the clinic at the provider's discretion.      For prescription refill requests, have your pharmacy contact our office and allow 72 hours for refills to be completed.    Today you received the following chemotherapy and/or immunotherapy agents: trastuzumab-anns (KANJINTI), pertuzumab (PERJETA), docetaxel (TAXOTERE), carboplatin (PARAPLATIN)       To help prevent nausea and vomiting after your treatment, we encourage you to take your nausea medication as directed.  BELOW ARE SYMPTOMS THAT SHOULD BE REPORTED IMMEDIATELY: *FEVER GREATER THAN 100.4 F (38 C) OR HIGHER *CHILLS OR SWEATING *NAUSEA AND VOMITING THAT IS NOT CONTROLLED WITH YOUR NAUSEA MEDICATION *UNUSUAL SHORTNESS OF BREATH *UNUSUAL BRUISING OR BLEEDING *URINARY PROBLEMS (pain or burning when urinating, or frequent urination) *BOWEL PROBLEMS (unusual diarrhea, constipation, pain near the anus) TENDERNESS IN MOUTH AND THROAT WITH OR WITHOUT PRESENCE OF ULCERS (sore throat, sores in mouth, or a toothache) UNUSUAL RASH, SWELLING OR PAIN  UNUSUAL VAGINAL DISCHARGE OR ITCHING   Items with * indicate a potential emergency and should be followed up as soon as possible or go to the Emergency Department  if any problems should occur.  Please show the CHEMOTHERAPY ALERT CARD or IMMUNOTHERAPY ALERT CARD at check-in to the Emergency Department and triage nurse.  Should you have questions after your visit or need to cancel or reschedule your appointment, please contact [facility] - A DEPT OF [facility]  Dept: [phone]  and follow the prompts.  Office hours are 8:00 a.m. to 4:30 p.m. Monday - Friday. Please note that voicemails left after 4:00 p.m. may not be returned until the following business day.  We are closed weekends and major holidays. You have access to a nurse at all times for urgent questions. Please call the main number to the clinic Dept: [phone] and follow the prompts.   For any non-urgent questions, you may also contact your provider using MyChart. We now offer e-Visits for anyone 70 and older to request care online for non-urgent symptoms. For details visit [email or web address].   Also download the MyChart app! Go to the app store, search MyChart, open the app, select [place], and log in with your MyChart username and password.

## 2024-04-10 NOTE — Assessment & Plan Note (Signed)
 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  Axillary tail mass ER 40%, PR 30%, HER2 positive, Ki-67 50%    Recommendation based on multidisciplinary tumor board: 1. Neoadjuvant chemotherapy with TCH Perjeta 6 cycles followed by Herceptin Perjeta maintenance versus Kadcyla maintenance (based on response to neoadjuvant chemo) for 1 year 2. Followed by breast conserving surgeries with targeted lymph node dissection 3. Followed by adjuvant radiation therapy if patient had lumpectomy 4.  Antiestrogen therapy based on final pathology --------------------------------------------------------------------------------------------------------------- 03/13/24:Breast MRI: Rt Breast mass 2.4 cm (2 masses 1.8 cm and 1.4 cm), 0.5 cm, LN 2.5 cm, 1.3 cm and several other LN 03/15/24: Rt breast primary with rt axillary, sub pectoral and Int mamm nodes, 7 mm Rt hepatic hemangioma, ? Colitis and 4 mm Lt lung nodule, Bone scan Neg ECHO: EF 55-60%   Current treatment: TCHP cycle 2

## 2024-04-10 NOTE — Progress Notes (Unsigned)
 [facility] Cancer Follow up:    Prentiss Spanner, MD [address] 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, estrogen receptor positive (HCC) Staging form: Breast, AJCC 8th Edition - Clinical: Stage IIB (cT2, cN1, cM0, G2, ER-, PR-, HER2+) - Signed by Odean Potts, MD on 03/06/2024 Stage prefix: Initial diagnosis Histologic grading system: 3 grade system    SUMMARY OF ONCOLOGIC HISTORY: Oncology History  Malignant neoplasm of upper-outer quadrant of right breast in female, estrogen receptor positive (HCC)  03/04/2024 Initial Diagnosis   Malignant neoplasm of upper-outer quadrant of right breast in female, estrogen receptor positive (HCC)   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  Axillary tail mass ER 40%, PR 30%, HER2 positive, Ki-67 50%   03/06/2024 Cancer Staging   Staging form: Breast, AJCC 8th Edition - Clinical: Stage IIB (cT2, cN1, cM0, G2, ER-, PR-, HER2+) - Signed by Odean Potts, MD on 03/06/2024 Stage prefix: Initial diagnosis Histologic grading system: 3 grade system   03/20/2024 -  Chemotherapy   Patient is on Treatment Plan : BREAST  Docetaxel  + Carboplatin  + Trastuzumab  + Pertuzumab   (TCHP) q21d        CURRENT THERAPY: TCHP cycle 2  INTERVAL HISTORY:  Discussed the use of AI scribe software for clinical note transcription with the patient, who gave verbal consent to proceed.  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  She is undergoing neoadjuvant chemotherapy with TCHP. Her recent echocardiogram shows a left ventricular ejection fraction of 55-60% with normal global longitudinal strain. She denies shortness of breath, chest pain, or palpitations.  She has gained five pounds over the past week with edema in her fingers, toes, feet, and legs. Her rings no longer fit, and her extremities appear 'puffy.' She monitors her weight daily.  She experiences mouth irritation following chemotherapy, describing her mouth as feeling like 'sandpaper' with burning sensations when consuming certain foods and drinks, particularly her morning diet Dr. Nunzio.  Her blood counts are stable with a white blood cell count of 9.0 and hemoglobin within normal limits. Liver enzymes show a slight elevation in ALT to 46, up from 23.     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  MEDICAL HISTORY: Past Medical History:  Diagnosis Date   Anxiety Na   No meds   Breast cancer (HCC) 02/27/2024   Family history of breast cancer    Sister at 1   Family history of thyroid cancer    Sister #2 at 74    SURGICAL HISTORY: Past Surgical History:  Procedure  Laterality Date   ABDOMINAL HYSTERECTOMY  2004   Partial   BREAST BIOPSY Right 02/26/2024   US  RT BREAST BX W LOC DEV EA ADD LESION IMG BX SPEC US  GUIDE 02/26/2024 [facility]   BREAST BIOPSY Right 02/26/2024   US  RT BREAST BX W LOC DEV 1ST LESION IMG BX SPEC US  GUIDE 02/26/2024 [facility]   CESAREAN SECTION  1996 2000    PORTACATH PLACEMENT N/A 03/19/2024   Procedure: INSERTION TUNNELED CENTRAL VENOUS DEVICE PORT LEFT SUBCLAVIAN WITH ULTRASOUND GUIDANCE;  Surgeon: Ebbie Cough, MD;  Location: [facility];  Service: General;  Laterality: N/A;   TUBAL LIGATION  2000    SOCIAL HISTORY: Social History   Socioeconomic History   Marital status: Married    Spouse name: Not on file   Number of children: Not on file   Years of education: Not on file   Highest education level: Not on file  Occupational History   Not on file  Tobacco Use   Smoking status: Never   Smokeless tobacco: Never  Vaping Use   Vaping status: Some Days   Substances: Nicotine  Substance and Sexual Activity   Alcohol use: Never   Drug use: Yes    Types: Marijuana    Comment: gummies   Sexual activity: Not on file  Other Topics Concern   Not on file  Social History Narrative   Not on file   Social Drivers of Health   Financial Resource Strain: Not on file  Food Insecurity: No Food Insecurity (03/05/2024)   Hunger Vital Sign    Worried About Running Out of Food in the Last Year: Never true    Ran Out of Food in the Last Year: Never true  Transportation Needs: No Transportation Needs (03/05/2024)   PRAPARE - Administrator, Civil Service (Medical): No    Lack of Transportation (Non-Medical): No  Physical Activity: Not on file  Stress: Not on file  Social Connections: Not on file  Intimate Partner Violence: Not on file    FAMILY HISTORY: Family History  Problem Relation Age of Onset   Arthritis Mother    Asthma Mother    Varicose Veins Mother    Heart disease Mother    Hypertension Mother    Hypertension Father    Heart disease Father    Breast cancer Sister 61   Heart disease Sister 82   Thyroid cancer Sister 7   Cancer Paternal Grandfather        dx 50+ Unknown Type of Cancer   Hypertension Half-Brother    Cancer Paternal Uncle        dx 50+ Unknown Type of Cancer    Cancer Paternal Uncle        dx 50+ Unknown Type of Cancer   Cancer Paternal Uncle        dx 50+ Unknown Type of Cancer    Review of Systems  Constitutional:  Negative for appetite change, chills, fatigue, fever and unexpected weight change.  HENT:   Negative for hearing loss, lump/mass and trouble swallowing.   Eyes:  Negative for eye problems and icterus.  Respiratory:  Negative for chest tightness, cough and shortness of breath.   Cardiovascular:  Negative for chest pain, leg swelling and palpitations.  Gastrointestinal:  Negative for abdominal distention, abdominal pain, constipation, diarrhea, nausea and vomiting.  Endocrine: Negative for hot flashes.  Genitourinary:  Negative for difficulty urinating.   Musculoskeletal:  Negative for arthralgias.  Skin:  Negative for itching and rash.  Neurological:  Negative for dizziness, extremity weakness, headaches and numbness.  Hematological:  Negative for adenopathy. Does not bruise/bleed easily.  Psychiatric/Behavioral:  Negative for depression. The patient is not nervous/anxious.       PHYSICAL EXAMINATION    There were no vitals filed for this visit.  Physical Exam Constitutional:      General: She is not in acute distress.    Appearance: Normal appearance. She is not toxic-appearing.  HENT:     Head: Normocephalic and atraumatic.     Mouth/Throat:     Mouth: Mucous membranes are moist.     Pharynx: Oropharynx is clear. No oropharyngeal exudate or posterior oropharyngeal erythema.  Eyes:     General: No scleral icterus. Cardiovascular:     Rate and Rhythm: Normal rate and regular rhythm.     Pulses: Normal pulses.     Heart sounds: Normal heart sounds.  Pulmonary:     Effort: Pulmonary effort is normal.     Breath sounds: Normal breath sounds.  Abdominal:     General: Abdomen is flat. Bowel sounds are normal. There is no distension.     Palpations: Abdomen is soft.     Tenderness: There is no abdominal tenderness.   Musculoskeletal:        General: No swelling.     Cervical back: Neck supple.  Lymphadenopathy:     Cervical: No cervical adenopathy.  Skin:    General: Skin is warm and dry.     Findings: No rash.  Neurological:     General: No focal deficit present.     Mental Status: She is alert.  Psychiatric:        Mood and Affect: Mood normal.        Behavior: Behavior normal.     LABORATORY DATA:  CBC    Component Value Date/Time   WBC 9.5 04/10/2024 1004   RBC 3.68 (L) 04/10/2024 1004   HGB 12.3 04/10/2024 1004   HGB 14.0 02/28/2019 1159   HCT 37.0 04/10/2024 1004   HCT 42.1 02/28/2019 1159   PLT 328 04/10/2024 1004   PLT 236 02/28/2019 1159   MCV 100.5 (H) 04/10/2024 1004   MCV 100 (H) 02/28/2019 1159   MCH 33.4 04/10/2024 1004   MCHC 33.2 04/10/2024 1004   RDW 14.2 04/10/2024 1004   RDW 11.6 (L) 02/28/2019 1159   LYMPHSABS 1.0 04/10/2024 1004   MONOABS 0.5 04/10/2024 1004   EOSABS 0.0 04/10/2024 1004   BASOSABS 0.0 04/10/2024 1004    CMP     Component Value Date/Time   NA 138 04/10/2024 1004   NA 139 02/28/2019 1159   K 3.9 04/10/2024 1004   CL 101 04/10/2024 1004   CO2 26 04/10/2024 1004   GLUCOSE 143 (H) 04/10/2024 1004   BUN 16 04/10/2024 1004   BUN 12 02/28/2019 1159   CREATININE 0.71 04/10/2024 1004   CALCIUM 9.2 04/10/2024 1004   PROT 6.1 (L) 04/10/2024 1004   PROT 6.4 02/28/2019 1159   ALBUMIN 4.0 04/10/2024 1004   ALBUMIN 4.3 02/28/2019 1159   AST 29 04/10/2024 1004   ALT 46 (H) 04/10/2024 1004   ALKPHOS 94 04/10/2024 1004   BILITOT 0.2 04/10/2024 1004   GFRNONAA >60 04/10/2024 1004   GFRAA 96 02/28/2019 1159     ASSESSMENT and THERAPY PLAN:   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  Axillary tail mass ER 40%, PR 30%, HER2 positive, Ki-67 50%    Recommendation based on multidisciplinary tumor board: 1. Neoadjuvant chemotherapy with TCH Perjeta 6 cycles followed by Herceptin Perjeta maintenance versus Kadcyla maintenance (based on response to neoadjuvant chemo) for 1 year 2. Followed by breast conserving surgeries with targeted lymph node dissection 3. Followed by adjuvant radiation therapy if patient had lumpectomy 4.  Antiestrogen therapy based on final pathology --------------------------------------------------------------------------------------------------------------- 03/13/24:Breast MRI: Rt Breast mass 2.4 cm (2 masses 1.8 cm and 1.4 cm), 0.5 cm, LN 2.5 cm, 1.3 cm and several other LN 03/15/24: Rt breast primary with rt axillary, sub pectoral and Int mamm nodes, 7 mm Rt hepatic hemangioma, ? Colitis and 4 mm Lt lung nodule, Bone scan Neg ECHO: EF 55-60%   Current treatment: TCHP cycle 2   Assessment and Plan Assessment & Plan Stage IIB, ER/PR negative, HER2 positive, invasive ductal carcinoma of right breast receiving neoadjuvant TCHP chemotherapy Currently undergoing neoadjuvant TCHP chemotherapy. Echocardiogram showed LVEF 55-60% with normal strain. No heart failure symptoms. Weight gain and edema likely due to Taxotere. - Proceed with second cycle of TCHP chemotherapy as scheduled. - Await decision from Dr. Gudina regarding repeat echocardiogram. - Prescribed Lasix for fluid management if swelling occurs. - Advised on potassium-rich foods to prevent hypokalemia. - Instructed to monitor for symptoms of heart failure.  Chemotherapy-induced oral mucositis Oral mucositis with sandpaper-like sensation and burning from carbonation. Symptoms persist post-chemotherapy. - Prescribed Magic Mouthwash for oral mucositis. - Advised on using popsicles during chemotherapy to alleviate symptoms.  Chemotherapy-induced mild neuropathy Mild  neuropathy with occasional tingling in fingertips and toes. No significant numbness. - Advised on using popsicles during chemotherapy to alleviate tingling.  Chemotherapy-induced mild transaminitis Mild elevation in ALT at 46, likely related to chemotherapy. No dose adjustment needed. - Advised on maintaining hydration to support liver function.       All questions were answered. The patient knows to call the clinic with any problems, questions or concerns. We can certainly see the patient much sooner if necessary.  Total encounter time:30 minutes*in face-to-face visit time, chart review, lab review, care coordination, order entry, and documentation of the encounter time.    Morna Kendall, NP 04/10/24 11:06 AM Medical Oncology and Hematology [facility] [address] [place], [place] [address] Tel. [phone]    Fax. [phone]  *Total Encounter Time as defined by the Centers for Medicare and Medicaid Services includes, in addition to the face-to-face time of a patient visit (documented in the note above) non-face-to-face time: obtaining and reviewing outside history, ordering and reviewing medications, tests or procedures, care coordination (communications with other health care professionals or caregivers) and documentation in the medical record.

## 2024-04-11 ENCOUNTER — Other Ambulatory Visit: Payer: Self-pay

## 2024-04-11 ENCOUNTER — Encounter: Payer: Self-pay | Admitting: Hematology and Oncology

## 2024-04-12 ENCOUNTER — Inpatient Hospital Stay

## 2024-04-12 DIAGNOSIS — Z5111 Encounter for antineoplastic chemotherapy: Secondary | ICD-10-CM | POA: Diagnosis not present

## 2024-04-12 DIAGNOSIS — Z170 Estrogen receptor positive status [ER+]: Secondary | ICD-10-CM

## 2024-04-12 MED ORDER — PEGFILGRASTIM-JMDB 6 MG/0.6ML ~~LOC~~ SOSY
6.0000 mg | PREFILLED_SYRINGE | Freq: Once | SUBCUTANEOUS | Status: AC
  Administered 2024-04-12: 6 mg via SUBCUTANEOUS
  Filled 2024-04-12: qty 0.6

## 2024-04-24 ENCOUNTER — Telehealth: Payer: Self-pay | Admitting: *Deleted

## 2024-04-24 NOTE — Telephone Encounter (Signed)
 Received call from pt stating she is experiencing slight chest and nasal congestion and requesting advice from MD what OTC medication she can take.  Per MD, okay for pt to take OTC Mucinex DM. Pt educated if symptoms get worse and pt develops a productive cough, to let our office know.  Pt verbalized understanding.

## 2024-04-25 ENCOUNTER — Ambulatory Visit (HOSPITAL_COMMUNITY)
Admission: RE | Admit: 2024-04-25 | Discharge: 2024-04-25 | Disposition: A | Source: Ambulatory Visit | Attending: Adult Health | Admitting: Adult Health

## 2024-04-25 DIAGNOSIS — R600 Localized edema: Secondary | ICD-10-CM

## 2024-04-25 LAB — ECHOCARDIOGRAM COMPLETE
AR max vel: 2.64 cm2
AV Area VTI: 2.54 cm2
AV Area mean vel: 2.44 cm2
AV Mean grad: 5 mmHg
AV Peak grad: 8.4 mmHg
Ao pk vel: 1.45 m/s
Area-P 1/2: 4.52 cm2
Calc EF: 75.4 %
S' Lateral: 3 cm
Single Plane A2C EF: 72.9 %
Single Plane A4C EF: 78.5 %

## 2024-05-01 ENCOUNTER — Inpatient Hospital Stay (HOSPITAL_BASED_OUTPATIENT_CLINIC_OR_DEPARTMENT_OTHER): Admitting: Hematology and Oncology

## 2024-05-01 ENCOUNTER — Inpatient Hospital Stay: Attending: Hematology and Oncology

## 2024-05-01 ENCOUNTER — Inpatient Hospital Stay

## 2024-05-01 VITALS — BP 118/74 | HR 86 | Temp 97.8°F | Resp 17 | Wt 122.8 lb

## 2024-05-01 VITALS — BP 110/74 | HR 78 | Resp 17

## 2024-05-01 DIAGNOSIS — Z803 Family history of malignant neoplasm of breast: Secondary | ICD-10-CM | POA: Diagnosis not present

## 2024-05-01 DIAGNOSIS — R21 Rash and other nonspecific skin eruption: Secondary | ICD-10-CM | POA: Insufficient documentation

## 2024-05-01 DIAGNOSIS — T451X5A Adverse effect of antineoplastic and immunosuppressive drugs, initial encounter: Secondary | ICD-10-CM | POA: Diagnosis not present

## 2024-05-01 DIAGNOSIS — Z79899 Other long term (current) drug therapy: Secondary | ICD-10-CM | POA: Insufficient documentation

## 2024-05-01 DIAGNOSIS — Z170 Estrogen receptor positive status [ER+]: Secondary | ICD-10-CM | POA: Insufficient documentation

## 2024-05-01 DIAGNOSIS — R61 Generalized hyperhidrosis: Secondary | ICD-10-CM | POA: Insufficient documentation

## 2024-05-01 DIAGNOSIS — C50611 Malignant neoplasm of axillary tail of right female breast: Secondary | ICD-10-CM | POA: Insufficient documentation

## 2024-05-01 DIAGNOSIS — F17290 Nicotine dependence, other tobacco product, uncomplicated: Secondary | ICD-10-CM | POA: Diagnosis not present

## 2024-05-01 DIAGNOSIS — R432 Parageusia: Secondary | ICD-10-CM | POA: Diagnosis not present

## 2024-05-01 DIAGNOSIS — Z5189 Encounter for other specified aftercare: Secondary | ICD-10-CM | POA: Insufficient documentation

## 2024-05-01 DIAGNOSIS — L299 Pruritus, unspecified: Secondary | ICD-10-CM | POA: Insufficient documentation

## 2024-05-01 DIAGNOSIS — R232 Flushing: Secondary | ICD-10-CM | POA: Diagnosis not present

## 2024-05-01 DIAGNOSIS — R197 Diarrhea, unspecified: Secondary | ICD-10-CM | POA: Insufficient documentation

## 2024-05-01 DIAGNOSIS — R5383 Other fatigue: Secondary | ICD-10-CM | POA: Insufficient documentation

## 2024-05-01 DIAGNOSIS — Z5111 Encounter for antineoplastic chemotherapy: Secondary | ICD-10-CM | POA: Insufficient documentation

## 2024-05-01 DIAGNOSIS — Z5112 Encounter for antineoplastic immunotherapy: Secondary | ICD-10-CM | POA: Insufficient documentation

## 2024-05-01 DIAGNOSIS — E876 Hypokalemia: Secondary | ICD-10-CM | POA: Insufficient documentation

## 2024-05-01 LAB — CMP (CANCER CENTER ONLY)
ALT: 45 U/L — ABNORMAL HIGH (ref 0–44)
AST: 39 U/L (ref 15–41)
Albumin: 3.9 g/dL (ref 3.5–5.0)
Alkaline Phosphatase: 90 U/L (ref 38–126)
Anion gap: 10 (ref 5–15)
BUN: 18 mg/dL (ref 6–20)
CO2: 24 mmol/L (ref 22–32)
Calcium: 8.9 mg/dL (ref 8.9–10.3)
Chloride: 105 mmol/L (ref 98–111)
Creatinine: 0.58 mg/dL (ref 0.44–1.00)
GFR, Estimated: >60 mL/min (ref >60)
Glucose, Bld: 125 mg/dL — ABNORMAL HIGH (ref 70–99)
Potassium: 3.6 mmol/L (ref 3.5–5.1)
Sodium: 138 mmol/L (ref 135–145)
Total Bilirubin: 0.3 mg/dL (ref 0.0–1.2)
Total Protein: 6.1 g/dL — ABNORMAL LOW (ref 6.5–8.1)

## 2024-05-01 LAB — CBC WITH DIFFERENTIAL (CANCER CENTER ONLY)
Abs Immature Granulocytes: 0.04 K/uL (ref 0.00–0.07)
Basophils Absolute: 0 K/uL (ref 0.0–0.1)
Basophils Relative: 0 %
Eosinophils Absolute: 0 K/uL (ref 0.0–0.5)
Eosinophils Relative: 0 %
HCT: 33.9 % — ABNORMAL LOW (ref 36.0–46.0)
Hemoglobin: 11.4 g/dL — ABNORMAL LOW (ref 12.0–15.0)
Immature Granulocytes: 1 %
Lymphocytes Relative: 27 %
Lymphs Abs: 2.4 K/uL (ref 0.7–4.0)
MCH: 33.7 pg (ref 26.0–34.0)
MCHC: 33.6 g/dL (ref 30.0–36.0)
MCV: 100.3 fL — ABNORMAL HIGH (ref 80.0–100.0)
Monocytes Absolute: 0.7 K/uL (ref 0.1–1.0)
Monocytes Relative: 7 %
Neutro Abs: 5.8 K/uL (ref 1.7–7.7)
Neutrophils Relative %: 65 %
Platelet Count: 229 K/uL (ref 150–400)
RBC: 3.38 MIL/uL — ABNORMAL LOW (ref 3.87–5.11)
RDW: 15.9 % — ABNORMAL HIGH (ref 11.5–15.5)
WBC Count: 8.9 K/uL (ref 4.0–10.5)
nRBC: 0 % (ref 0.0–0.2)

## 2024-05-01 MED ORDER — SODIUM CHLORIDE 0.9 % IV SOLN
420.0000 mg | Freq: Once | INTRAVENOUS | Status: AC
  Administered 2024-05-01: 420 mg via INTRAVENOUS
  Filled 2024-05-01: qty 14

## 2024-05-01 MED ORDER — PALONOSETRON HCL INJECTION 0.25 MG/5ML
0.2500 mg | Freq: Once | INTRAVENOUS | Status: AC
  Administered 2024-05-01: 0.25 mg via INTRAVENOUS
  Filled 2024-05-01: qty 5

## 2024-05-01 MED ORDER — SODIUM CHLORIDE 0.9 % IV SOLN: INTRAVENOUS | Status: DC

## 2024-05-01 MED ORDER — TRASTUZUMAB-ANNS CHEMO 150 MG IV SOLR
6.0000 mg/kg | Freq: Once | INTRAVENOUS | Status: AC
  Administered 2024-05-01: 300 mg via INTRAVENOUS
  Filled 2024-05-01: qty 14.29

## 2024-05-01 MED ORDER — APREPITANT 130 MG/18ML IV EMUL
130.0000 mg | Freq: Once | INTRAVENOUS | Status: AC
  Administered 2024-05-01: 130 mg via INTRAVENOUS

## 2024-05-01 MED ORDER — DIPHENHYDRAMINE HCL 25 MG PO CAPS
25.0000 mg | ORAL_CAPSULE | Freq: Once | ORAL | Status: AC
  Administered 2024-05-01: 25 mg via ORAL
  Filled 2024-05-01: qty 1

## 2024-05-01 MED ORDER — DEXAMETHASONE SOD PHOSPHATE PF 10 MG/ML IJ SOLN
10.0000 mg | Freq: Once | INTRAMUSCULAR | Status: AC
  Administered 2024-05-01: 10 mg via INTRAVENOUS

## 2024-05-01 MED ORDER — SODIUM CHLORIDE 0.9 % IV SOLN
65.0000 mg/m2 | Freq: Once | INTRAVENOUS | Status: AC
  Administered 2024-05-01: 98 mg via INTRAVENOUS
  Filled 2024-05-01: qty 9.8

## 2024-05-01 MED ORDER — ACETAMINOPHEN 325 MG PO TABS
650.0000 mg | ORAL_TABLET | Freq: Once | ORAL | Status: AC
  Administered 2024-05-01: 650 mg via ORAL
  Filled 2024-05-01: qty 2

## 2024-05-01 MED ORDER — SODIUM CHLORIDE 0.9 % IV SOLN
458.0000 mg | Freq: Once | INTRAVENOUS | Status: AC
  Administered 2024-05-01: 460 mg via INTRAVENOUS
  Filled 2024-05-01: qty 46

## 2024-05-01 NOTE — Assessment & Plan Note (Signed)
 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  Axillary tail mass ER 40%, PR 30%, HER2 positive, Ki-67 50%    Recommendation based on multidisciplinary tumor board: 1. Neoadjuvant chemotherapy with [facility] Perjeta  6 cycles followed by Herceptin  Perjeta  maintenance versus Kadcyla maintenance (based on response to neoadjuvant chemo) for 1 year 2. Followed by breast conserving surgeries with targeted lymph node dissection 3. Followed by adjuvant radiation therapy if patient had lumpectomy 4.  Antiestrogen therapy based on final pathology --------------------------------------------------------------------------------------------------------------- 03/13/24:Breast MRI: Rt Breast mass 2.4 cm (2 masses 1.8 cm and 1.4 cm), 0.5 cm, LN 2.5 cm, 1.3 cm and several other LN 03/15/24: Rt breast primary with rt axillary, sub pectoral and Int mamm nodes, 7 mm Rt hepatic hemangioma, ? Colitis and 4 mm Lt lung nodule, Bone scan Neg ECHO: EF 55-60%   Current treatment: Cycle 3 TCHP   Chemo toxicities: Mouth sores Fatigue Mild diarrhea Low neutrophil count: I will reduce the dosage of next chemotherapy.   Denied nausea vomiting or bone pain   Return to clinic in 3 weeks for cycle 4

## 2024-05-01 NOTE — Patient Instructions (Signed)
 [facility] - A DEPT OF [facility]  Discharge Instructions: Thank you for choosing [facility] to provide your oncology and hematology care.   If you have a lab appointment with the Cancer Center, please go directly to the Cancer Center and check in at the registration area.   Wear comfortable clothing and clothing appropriate for easy access to any Portacath or PICC line.   We strive to give you quality time with your provider. You may need to reschedule your appointment if you arrive late (15 or more minutes).  Arriving late affects you and other patients whose appointments are after yours.  Also, if you miss three or more appointments without notifying the office, you may be dismissed from the clinic at the providers discretion.      For prescription refill requests, have your pharmacy contact our office and allow 72 hours for refills to be completed.    Today you received the following chemotherapy and/or immunotherapy agents: trastuzumab , pertuzumab , docetaxel  and carboplatin       To help prevent nausea and vomiting after your treatment, we encourage you to take your nausea medication as directed.  BELOW ARE SYMPTOMS THAT SHOULD BE REPORTED IMMEDIATELY: *FEVER GREATER THAN 100.4 F (38 C) OR HIGHER *CHILLS OR SWEATING *NAUSEA AND VOMITING THAT IS NOT CONTROLLED WITH YOUR NAUSEA MEDICATION *UNUSUAL SHORTNESS OF BREATH *UNUSUAL BRUISING OR BLEEDING *URINARY PROBLEMS (pain or burning when urinating, or frequent urination) *BOWEL PROBLEMS (unusual diarrhea, constipation, pain near the anus) TENDERNESS IN MOUTH AND THROAT WITH OR WITHOUT PRESENCE OF ULCERS (sore throat, sores in mouth, or a toothache) UNUSUAL RASH, SWELLING OR PAIN  UNUSUAL VAGINAL DISCHARGE OR ITCHING   Items with * indicate a potential emergency and should be followed up as soon as possible or go to the Emergency Department if any problems should occur.  Please show the  CHEMOTHERAPY ALERT CARD or IMMUNOTHERAPY ALERT CARD at check-in to the Emergency Department and triage nurse.  Should you have questions after your visit or need to cancel or reschedule your appointment, please contact [facility] - A DEPT OF [facility]  Dept: [phone]  and follow the prompts.  Office hours are 8:00 a.m. to 4:30 p.m. Monday - Friday. Please note that voicemails left after 4:00 p.m. may not be returned until the following business day.  We are closed weekends and major holidays. You have access to a nurse at all times for urgent questions. Please call the main number to the clinic Dept: [phone] and follow the prompts.   For any non-urgent questions, you may also contact your provider using MyChart. We now offer e-Visits for anyone 73 and older to request care online for non-urgent symptoms. For details visit [email or web address].   Also download the MyChart app! Go to the app store, search MyChart, open the app, select [facility], and log in with your MyChart username and password.

## 2024-05-01 NOTE — Progress Notes (Signed)
 Patient Care Team: Prentiss Spanner, MD as PCP - General Gerome, Devere HERO, RN as Oncology Nurse Navigator Tyree Nanetta SAILOR, RN as Oncology Nurse Navigator Ebbie Cough, MD as Consulting Physician (General Surgery) Odean Potts, MD as Consulting Physician (Hematology and Oncology) Shannon Agent, MD as Consulting Physician (Radiation Oncology)  DIAGNOSIS:  Encounter Diagnosis  Name Primary?   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  Axillary tail mass ER 40%, PR 30%, HER2 positive, Ki-67 50%   03/06/2024 Cancer Staging   Staging form: Breast, AJCC 8th Edition - Clinical: Stage IIB (cT2, cN1, cM0, G2, ER-, PR-, HER2+) - Signed by Odean Potts, MD on 03/06/2024 Stage prefix: Initial diagnosis Histologic grading system: 3 grade system   03/20/2024 -  Chemotherapy   Patient is on Treatment Plan : BREAST  Docetaxel  + Carboplatin  + Trastuzumab  + Pertuzumab   (TCHP) q21d        CHIEF COMPLIANT: Cycle 3 TCHP  HISTORY OF PRESENT ILLNESS: History of Present Illness Dana Baxter is a 53 year old female with newly diagnosed Stage IIB HER2-positive, 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  She is receiving neoadjuvant TCHP for HER2-positive, ER-positive right breast cancer. After her most recent infusion she developed a pruritic rash on her back that resolved in 3 to 4 days and then had severe diarrhea from Sunday into Monday with painful perianal irritation that improved with topical diaper cream. She has not had vomiting this cycle.  She continues to have intermittent pruritic rashes that resolve within a few days. Residual itching is controlled with nightly diphenhydramine , which also helps with sleep. She reports frequent nocturnal diaphoresis that sometimes requires changing pajamas. She has swelling from the knees down that worsens without compression socks at work. She used prescribed furosemide  for three doses as instructed with improvement in leg swelling and bloating. She reports adequate hydration and potassium intake.  She reports morning leg stiffness with fluid accumulation and notes worsening hot flashes and joint stiffness over the past 18 months. Fatigue is significant, with exhaustion after routine home and work activities. Over the past week she has had brief episodes of vertigo when standing or turning her head, without persistent symptoms.  She has dysgeusia and oral discomfort described as a sandpaper mouth and loss of taste for about one week after chemotherapy, with gradual recovery of taste over the past few days. She can now tolerate some foods, continues to focus on potassium-rich foods, and receives help with meal preparation during periods of altered taste and fatigue.      ALLERGIES:  is allergic to penicillins, sulfa antibiotics, metronidazole , and morphine.  MEDICATIONS:  Current Outpatient Medications  Medication Sig Dispense Refill   dexamethasone  (DECADRON ) 4 MG tablet Take 1 tablet day before chemo and 1  tablet day after chemo with food 30 tablet 1   furosemide  (LASIX ) 20 MG tablet Take 1 tablet (20 mg total) by mouth  daily as needed. 30 tablet 0   lidocaine -prilocaine  (EMLA ) cream Apply to affected area once 30 g 3   magic mouthwash (nystatin , lidocaine , diphenhydrAMINE , alum & mag hydroxide) suspension Swish and swallow 5 mLs by mouth 3 (three) times daily. 240 mL 0   nitrofurantoin  (FURADANTIN ) 25 MG/5ML suspension Take 50 mg by mouth 4 (four) times daily.     omeprazole (PRILOSEC OTC) 20 MG tablet Take 20 mg by mouth daily.     ondansetron  (ZOFRAN ) 8 MG tablet Take 1 tablet (8 mg total) by mouth every 8 (eight) hours as needed for nausea or vomiting. Start on the third day after chemotherapy. 30 tablet 1   phenazopyridine (PYRIDIUM) 100 MG tablet Take 100 mg by mouth 3 (three) times daily as needed for pain.     prochlorperazine  (COMPAZINE ) 10 MG tablet Take 1 tablet (10 mg total) by mouth every 6 (six) hours as needed for nausea or vomiting. 30 tablet 1   No current facility-administered medications for this visit.   Facility-Administered Medications Ordered in Other Visits  Medication Dose Route Frequency Provider Last Rate Last Admin   0.9 %  sodium chloride  infusion   Intravenous Continuous Odean Potts, MD   Stopped at 05/01/24 1607    PHYSICAL EXAMINATION: ECOG PERFORMANCE STATUS: 1 - Symptomatic but completely ambulatory  Vitals:   05/01/24 1051  BP: 118/74  Pulse: 86  Resp: 17  Temp: 97.8 F (36.6 C)  SpO2: 100%   Filed Weights   05/01/24 1051  Weight: 122 lb 12.8 oz (55.7 kg)     LABORATORY DATA:  I have reviewed the data as listed    Latest Ref Rng & Units 05/01/2024   10:29 AM 04/10/2024   10:04 AM 03/27/2024   10:41 AM  CMP  Glucose 70 - 99 mg/dL 874  856  890   BUN 6 - 20 mg/dL 18  16  14    Creatinine 0.44 - 1.00 mg/dL 9.41  9.28  9.28   Sodium 135 - 145 mmol/L 138  138  133   Potassium 3.5 - 5.1 mmol/L 3.6  3.9  3.6   Chloride 98 - 111 mmol/L 105  101  101   CO2 22 - 32 mmol/L 24  26  28    Calcium 8.9 - 10.3 mg/dL 8.9  9.2  8.5   Total Protein 6.5 - 8.1 g/dL 6.1   6.1  6.0   Total Bilirubin 0.0 - 1.2 mg/dL 0.3  0.2  0.2   Alkaline Phos 38 - 126 U/L 90  94  60   AST 15 - 41 U/L 39  29  13   ALT 0 - 44 U/L 45  46  23     Lab Results  Component Value Date   WBC 8.9 05/01/2024   HGB 11.4 (L) 05/01/2024   HCT 33.9 (L) 05/01/2024   MCV 100.3 (H) 05/01/2024   PLT 229 05/01/2024   NEUTROABS 5.8 05/01/2024    ASSESSMENT & PLAN:  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  Axillary tail mass ER 40%, PR 30%, HER2  positive, Ki-67 50%    Recommendation based on multidisciplinary tumor board: 1. Neoadjuvant chemotherapy with [facility] Perjeta  6 cycles followed by Herceptin  Perjeta  maintenance versus Kadcyla maintenance (based on response to neoadjuvant chemo) for 1 year 2. Followed by breast conserving surgeries with targeted lymph node dissection 3. Followed by adjuvant radiation therapy if patient had lumpectomy 4.  Antiestrogen therapy based on final pathology --------------------------------------------------------------------------------------------------------------- 03/13/24:Breast MRI: Rt Breast mass 2.4 cm (2 masses 1.8 cm and 1.4 cm), 0.5 cm, LN 2.5 cm, 1.3 cm and several other LN 03/15/24: Rt breast primary with rt axillary, sub pectoral and Int mamm nodes, 7 mm Rt hepatic hemangioma, ? Colitis and 4 mm Lt lung nodule, Bone scan Neg ECHO: EF 55-60%   Current treatment: Cycle 3 TCHP   Chemo toxicities: Mouth sores Fatigue Mild diarrhea Low neutrophil count: Improved with lower dosage of chemotherapy Skin rash: Resolved with topical cream Fatigue Vertigo-like symptoms   Denied nausea vomiting or bone pain   Return to clinic in 3 weeks for cycle  4 ------------------------------------- Assessment and Plan Assessment & Plan Estrogen receptor positive malignant neoplasm of axillary tail of right breast Fatigue and night sweats attributed to estrogen suppression and chemotherapy. - Monitored blood counts, renal and hepatic function, and adverse effects at each visit. - Encouraged hydration and nutritional support. - Discussed management of dysgeusia and food aversions, including timing of preferred foods when taste returns. - Reassured regarding transient vertigo-like symptoms and reinforced hydration.  Chemotherapy-induced adverse effects Experiencing multiple chemotherapy-related toxicities: neutropenia, diarrhea, Taxotere -associated rash, oral mucositis with dysgeusia, insomnia, night sweats, peripheral neuropathy, and significant localized edema. Diaper rash and skin irritation managed with topical agents. Edema managed with intermittent furosemide  and compression stockings, with good response. Taste changes and food aversions fluctuate throughout the cycle. Fatigue is prominent; reports transient vertigo-like symptoms. Hydration and nutritional intake are adequate, including potassium-rich foods to mitigate furosemide -induced hypokalemia. - Recommended continued use of compression stockings for edema. - Instructed on intermittent furosemide  use, not to exceed three doses before repeat laboratory evaluation; monitored potassium levels and encouraged dietary potassium intake. - Monitored for worsening neutropenia, fatigue, diarrhea, mucositis, rash, neuropathy, and edema.      No orders of the defined types were placed in this encounter.  The patient has a good understanding of the overall plan. she agrees with it. she will call with any problems that may develop before the next visit here.  I personally spent a total of 30 minutes in the care of the patient today including preparing to see the patient, getting/reviewing separately  obtained history, performing a medically appropriate exam/evaluation, counseling and educating, placing orders, referring and communicating with other health care professionals, documenting clinical information in the EHR, independently interpreting results, communicating results, and coordinating care.   Dana K Myeesha Shane, MD 05/01/24

## 2024-05-02 ENCOUNTER — Telehealth: Payer: Self-pay | Admitting: Hematology and Oncology

## 2024-05-02 NOTE — Telephone Encounter (Signed)
 Left vm for pt about removed MD visit from up coming scheduled appt

## 2024-05-03 ENCOUNTER — Inpatient Hospital Stay

## 2024-05-03 VITALS — BP 110/77 | HR 78 | Temp 98.7°F | Resp 17

## 2024-05-03 DIAGNOSIS — C50611 Malignant neoplasm of axillary tail of right female breast: Secondary | ICD-10-CM

## 2024-05-03 DIAGNOSIS — Z5111 Encounter for antineoplastic chemotherapy: Secondary | ICD-10-CM | POA: Diagnosis not present

## 2024-05-03 MED ORDER — PEGFILGRASTIM-JMDB 6 MG/0.6ML ~~LOC~~ SOSY
6.0000 mg | PREFILLED_SYRINGE | Freq: Once | SUBCUTANEOUS | Status: AC
  Administered 2024-05-03: 6 mg via SUBCUTANEOUS
  Filled 2024-05-03: qty 0.6

## 2024-05-09 ENCOUNTER — Telehealth: Payer: Self-pay

## 2024-05-09 ENCOUNTER — Encounter: Payer: Self-pay | Admitting: Hematology and Oncology

## 2024-05-09 NOTE — Telephone Encounter (Signed)
 Pt called and reported port site discomfort worst than usual after chemo. Pt submitted a picutre in portal, RN assessed the image and pt's reported symptoms. Pt denies any redness, swelling, heat, or drainage at site. Port was last accessed 12/10 at treatment. Pt reports pain began 12/13. RN advised pt use ice pack intermittently 4 times a day and take tylenol  up to the daily limit of 4,000 mg to see if she experiences relief and to call back if she notices any swelling, redness, drainage. We advised pt to call us  back on 12/21 if still experiencing discomfort.

## 2024-05-22 ENCOUNTER — Inpatient Hospital Stay: Admitting: Hematology and Oncology

## 2024-05-22 ENCOUNTER — Inpatient Hospital Stay (HOSPITAL_BASED_OUTPATIENT_CLINIC_OR_DEPARTMENT_OTHER): Admitting: Adult Health

## 2024-05-22 ENCOUNTER — Inpatient Hospital Stay

## 2024-05-22 ENCOUNTER — Other Ambulatory Visit: Payer: Self-pay | Admitting: Adult Health

## 2024-05-22 ENCOUNTER — Encounter: Payer: Self-pay | Admitting: Adult Health

## 2024-05-22 ENCOUNTER — Other Ambulatory Visit (HOSPITAL_COMMUNITY): Payer: Self-pay

## 2024-05-22 VITALS — BP 106/81 | HR 79 | Temp 98.0°F | Resp 18 | Wt 124.0 lb

## 2024-05-22 DIAGNOSIS — C50411 Malignant neoplasm of upper-outer quadrant of right female breast: Secondary | ICD-10-CM | POA: Diagnosis not present

## 2024-05-22 DIAGNOSIS — C50611 Malignant neoplasm of axillary tail of right female breast: Secondary | ICD-10-CM

## 2024-05-22 DIAGNOSIS — Z170 Estrogen receptor positive status [ER+]: Secondary | ICD-10-CM

## 2024-05-22 DIAGNOSIS — Z5111 Encounter for antineoplastic chemotherapy: Secondary | ICD-10-CM | POA: Diagnosis not present

## 2024-05-22 LAB — CBC WITH DIFFERENTIAL (CANCER CENTER ONLY)
Abs Immature Granulocytes: 0.01 K/uL (ref 0.00–0.07)
Basophils Absolute: 0 K/uL (ref 0.0–0.1)
Basophils Relative: 1 %
Eosinophils Absolute: 0 K/uL (ref 0.0–0.5)
Eosinophils Relative: 0 %
HCT: 34.1 % — ABNORMAL LOW (ref 36.0–46.0)
Hemoglobin: 11.4 g/dL — ABNORMAL LOW (ref 12.0–15.0)
Immature Granulocytes: 0 %
Lymphocytes Relative: 34 %
Lymphs Abs: 2.2 K/uL (ref 0.7–4.0)
MCH: 34.3 pg — ABNORMAL HIGH (ref 26.0–34.0)
MCHC: 33.4 g/dL (ref 30.0–36.0)
MCV: 102.7 fL — ABNORMAL HIGH (ref 80.0–100.0)
Monocytes Absolute: 0.7 K/uL (ref 0.1–1.0)
Monocytes Relative: 10 %
Neutro Abs: 3.7 K/uL (ref 1.7–7.7)
Neutrophils Relative %: 55 %
Platelet Count: 234 K/uL (ref 150–400)
RBC: 3.32 MIL/uL — ABNORMAL LOW (ref 3.87–5.11)
RDW: 16 % — ABNORMAL HIGH (ref 11.5–15.5)
WBC Count: 6.6 K/uL (ref 4.0–10.5)
nRBC: 0 % (ref 0.0–0.2)

## 2024-05-22 LAB — CMP (CANCER CENTER ONLY)
ALT: 33 U/L (ref 0–44)
AST: 33 U/L (ref 15–41)
Albumin: 3.9 g/dL (ref 3.5–5.0)
Alkaline Phosphatase: 96 U/L (ref 38–126)
Anion gap: 12 (ref 5–15)
BUN: 15 mg/dL (ref 6–20)
CO2: 24 mmol/L (ref 22–32)
Calcium: 8.8 mg/dL — ABNORMAL LOW (ref 8.9–10.3)
Chloride: 105 mmol/L (ref 98–111)
Creatinine: 0.64 mg/dL (ref 0.44–1.00)
GFR, Estimated: >60 mL/min
Glucose, Bld: 129 mg/dL — ABNORMAL HIGH (ref 70–99)
Potassium: 3.3 mmol/L — ABNORMAL LOW (ref 3.5–5.1)
Sodium: 140 mmol/L (ref 135–145)
Total Bilirubin: <0.2 mg/dL (ref 0.0–1.2)
Total Protein: 5.9 g/dL — ABNORMAL LOW (ref 6.5–8.1)

## 2024-05-22 MED ORDER — SODIUM CHLORIDE 0.9 % IV SOLN: INTRAVENOUS | Status: DC

## 2024-05-22 MED ORDER — SODIUM CHLORIDE 0.9 % IV SOLN
65.0000 mg/m2 | Freq: Once | INTRAVENOUS | Status: AC
  Administered 2024-05-22: 98 mg via INTRAVENOUS
  Filled 2024-05-22: qty 9.8

## 2024-05-22 MED ORDER — SODIUM CHLORIDE 0.9 % IV SOLN
420.0000 mg | Freq: Once | INTRAVENOUS | Status: AC
  Administered 2024-05-22: 420 mg via INTRAVENOUS
  Filled 2024-05-22: qty 14

## 2024-05-22 MED ORDER — TRASTUZUMAB-ANNS CHEMO 150 MG IV SOLR
6.0000 mg/kg | Freq: Once | INTRAVENOUS | Status: AC
  Administered 2024-05-22: 300 mg via INTRAVENOUS
  Filled 2024-05-22: qty 14.29

## 2024-05-22 MED ORDER — ACETAMINOPHEN 325 MG PO TABS
650.0000 mg | ORAL_TABLET | Freq: Once | ORAL | Status: AC
  Administered 2024-05-22: 650 mg via ORAL
  Filled 2024-05-22: qty 2

## 2024-05-22 MED ORDER — PALONOSETRON HCL INJECTION 0.25 MG/5ML
0.2500 mg | Freq: Once | INTRAVENOUS | Status: AC
  Administered 2024-05-22: 0.25 mg via INTRAVENOUS
  Filled 2024-05-22: qty 5

## 2024-05-22 MED ORDER — DIPHENHYDRAMINE HCL 25 MG PO CAPS
25.0000 mg | ORAL_CAPSULE | Freq: Once | ORAL | Status: AC
  Administered 2024-05-22: 25 mg via ORAL
  Filled 2024-05-22: qty 1

## 2024-05-22 MED ORDER — DEXAMETHASONE SOD PHOSPHATE PF 10 MG/ML IJ SOLN
10.0000 mg | Freq: Once | INTRAMUSCULAR | Status: AC
  Administered 2024-05-22: 10 mg via INTRAVENOUS

## 2024-05-22 MED ORDER — APREPITANT 130 MG/18ML IV EMUL
130.0000 mg | Freq: Once | INTRAVENOUS | Status: AC
  Administered 2024-05-22: 130 mg via INTRAVENOUS
  Filled 2024-05-22: qty 18

## 2024-05-22 MED ORDER — FUROSEMIDE 20 MG PO TABS
20.0000 mg | ORAL_TABLET | Freq: Every day | ORAL | 0 refills | Status: AC | PRN
  Filled 2024-05-22: qty 30, 30d supply, fill #0

## 2024-05-22 MED ORDER — SODIUM CHLORIDE 0.9 % IV SOLN
458.0000 mg | Freq: Once | INTRAVENOUS | Status: AC
  Administered 2024-05-22: 460 mg via INTRAVENOUS
  Filled 2024-05-22: qty 46

## 2024-05-22 MED ORDER — POTASSIUM CHLORIDE CRYS ER 10 MEQ PO TBCR
10.0000 meq | EXTENDED_RELEASE_TABLET | Freq: Every day | ORAL | 0 refills | Status: DC
  Filled 2024-05-22: qty 23, 23d supply, fill #0

## 2024-05-22 NOTE — Progress Notes (Signed)
 Pt observed for 30 minutes post Perjeta  infusion. Pt tolerated Tx well w/out incident. VSS prior to Docetaxel  administration.

## 2024-05-22 NOTE — Telephone Encounter (Signed)
 Patient seen in infusion by Morna Kendall, DNP to further assess edema

## 2024-05-22 NOTE — Patient Instructions (Signed)
 [facility] - A DEPT OF [facility]  Discharge Instructions: Thank you for choosing [facility] to provide your oncology and hematology care.   If you have a lab appointment with the Cancer Center, please go directly to the Cancer Center and check in at the registration area.   Wear comfortable clothing and clothing appropriate for easy access to any Portacath or PICC line.   We strive to give you quality time with your provider. You may need to reschedule your appointment if you arrive late (15 or more minutes).  Arriving late affects you and other patients whose appointments are after yours.  Also, if you miss three or more appointments without notifying the office, you may be dismissed from the clinic at the providers discretion.      For prescription refill requests, have your pharmacy contact our office and allow 72 hours for refills to be completed.    Today you received the following chemotherapy and/or immunotherapy agents: Trastuzumab -anns (Kanjinti ), Pertuzumab  (Perjeta ), Docetaxel  (Taxotere ), & Carboplatin  (Paraplatin )       To help prevent nausea and vomiting after your treatment, we encourage you to take your nausea medication as directed.  BELOW ARE SYMPTOMS THAT SHOULD BE REPORTED IMMEDIATELY: *FEVER GREATER THAN 100.4 F (38 C) OR HIGHER *CHILLS OR SWEATING *NAUSEA AND VOMITING THAT IS NOT CONTROLLED WITH YOUR NAUSEA MEDICATION *UNUSUAL SHORTNESS OF BREATH *UNUSUAL BRUISING OR BLEEDING *URINARY PROBLEMS (pain or burning when urinating, or frequent urination) *BOWEL PROBLEMS (unusual diarrhea, constipation, pain near the anus) TENDERNESS IN MOUTH AND THROAT WITH OR WITHOUT PRESENCE OF ULCERS (sore throat, sores in mouth, or a toothache) UNUSUAL RASH, SWELLING OR PAIN  UNUSUAL VAGINAL DISCHARGE OR ITCHING   Items with * indicate a potential emergency and should be followed up as soon as possible or go to the Emergency Department  if any problems should occur.  Please show the CHEMOTHERAPY ALERT CARD or IMMUNOTHERAPY ALERT CARD at check-in to the Emergency Department and triage nurse.  Should you have questions after your visit or need to cancel or reschedule your appointment, please contact [facility] - A DEPT OF [facility]  Dept: [phone]  and follow the prompts.  Office hours are 8:00 a.m. to 4:30 p.m. Monday - Friday. Please note that voicemails left after 4:00 p.m. may not be returned until the following business day.  We are closed weekends and major holidays. You have access to a nurse at all times for urgent questions. Please call the main number to the clinic Dept: [phone] and follow the prompts.   For any non-urgent questions, you may also contact your provider using MyChart. We now offer e-Visits for anyone 63 and older to request care online for non-urgent symptoms. For details visit [email or web address].   Also download the MyChart app! Go to the app store, search MyChart, open the app, select [place], and log in with your MyChart username and password.

## 2024-05-24 ENCOUNTER — Encounter: Payer: Self-pay | Admitting: Hematology and Oncology

## 2024-05-24 ENCOUNTER — Inpatient Hospital Stay: Attending: Hematology and Oncology

## 2024-05-24 VITALS — BP 120/88 | HR 71 | Temp 98.5°F | Resp 16

## 2024-05-24 DIAGNOSIS — R5383 Other fatigue: Secondary | ICD-10-CM | POA: Insufficient documentation

## 2024-05-24 DIAGNOSIS — Z79899 Other long term (current) drug therapy: Secondary | ICD-10-CM | POA: Insufficient documentation

## 2024-05-24 DIAGNOSIS — C50411 Malignant neoplasm of upper-outer quadrant of right female breast: Secondary | ICD-10-CM | POA: Insufficient documentation

## 2024-05-24 DIAGNOSIS — L299 Pruritus, unspecified: Secondary | ICD-10-CM | POA: Insufficient documentation

## 2024-05-24 DIAGNOSIS — Z79620 Long term (current) use of immunosuppressive biologic: Secondary | ICD-10-CM | POA: Diagnosis not present

## 2024-05-24 DIAGNOSIS — Z79630 Long term (current) use of alkylating agent: Secondary | ICD-10-CM | POA: Diagnosis not present

## 2024-05-24 DIAGNOSIS — Z5111 Encounter for antineoplastic chemotherapy: Secondary | ICD-10-CM | POA: Insufficient documentation

## 2024-05-24 DIAGNOSIS — Z17420 Hormone receptor negative with human epidermal growth factor receptor 2 positive status: Secondary | ICD-10-CM | POA: Insufficient documentation

## 2024-05-24 DIAGNOSIS — R21 Rash and other nonspecific skin eruption: Secondary | ICD-10-CM | POA: Insufficient documentation

## 2024-05-24 DIAGNOSIS — Z170 Estrogen receptor positive status [ER+]: Secondary | ICD-10-CM | POA: Insufficient documentation

## 2024-05-24 DIAGNOSIS — R600 Localized edema: Secondary | ICD-10-CM | POA: Insufficient documentation

## 2024-05-24 DIAGNOSIS — R197 Diarrhea, unspecified: Secondary | ICD-10-CM | POA: Insufficient documentation

## 2024-05-24 DIAGNOSIS — E876 Hypokalemia: Secondary | ICD-10-CM | POA: Diagnosis not present

## 2024-05-24 MED ORDER — PEGFILGRASTIM-JMDB 6 MG/0.6ML ~~LOC~~ SOSY
6.0000 mg | PREFILLED_SYRINGE | Freq: Once | SUBCUTANEOUS | Status: AC
  Administered 2024-05-24: 6 mg via SUBCUTANEOUS
  Filled 2024-05-24: qty 0.6

## 2024-05-24 NOTE — Progress Notes (Signed)
 [facility] Cancer Follow up:    Prentiss Spanner, MD [address] 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, estrogen receptor positive (HCC) Staging form: Breast, AJCC 8th Edition - Clinical: Stage IIB (cT2, cN1, cM0, G2, ER-, PR-, HER2+) - Signed by Odean Potts, MD on 03/06/2024 Stage prefix: Initial diagnosis Histologic grading system: 3 grade system    SUMMARY OF ONCOLOGIC HISTORY: Oncology History  Malignant neoplasm of upper-outer quadrant of right breast in female, estrogen receptor positive (HCC)  03/04/2024 Initial Diagnosis   Malignant neoplasm of upper-outer quadrant of right breast in female, estrogen receptor positive (HCC)   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  Axillary tail mass ER 40%, PR 30%, HER2 positive, Ki-67 50%   03/06/2024 Cancer Staging   Staging form: Breast, AJCC 8th Edition - Clinical: Stage IIB (cT2, cN1, cM0, G2, ER-, PR-, HER2+) - Signed by Odean Potts, MD on 03/06/2024 Stage prefix: Initial diagnosis Histologic grading system: 3 grade system   03/20/2024 -  Chemotherapy   Patient is on Treatment Plan : BREAST  Docetaxel  + Carboplatin  + Trastuzumab  + Pertuzumab   (TCHP) q21d        CURRENT THERAPY: TCHP  INTERVAL HISTORY:  Discussed the use of AI scribe software for clinical note transcription with the patient, who gave verbal consent to proceed.  History of Present Illness Dana Baxter is a 54 year old female undergoing antineoplastic chemotherapy who presents for cycle 4 treatment of TCHP.  She presents for her fourth chemotherapy cycle with  persistent ankle and leg edema and a 2-pound weight increase, which she attributes to fluid retention. Swelling is visible and dependent. She notes more sedentary time, though she stands for prolonged periods at work, and she consistently uses compression socks.  She takes furosemide  for fluid retention and uses bananas on furosemide  days. She denies paresthesias or gastrointestinal symptoms. She notes mild nasal congestion and occasional chest popping without recent respiratory infection.  A recent echocardiogram was completed for evaluation of her edema on 04/25/2024. 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  MEDICAL HISTORY: Past Medical History:  Diagnosis Date   Anxiety Na   No meds   Breast cancer (HCC) 02/27/2024   Family history of breast cancer    Sister at 66   Family history of thyroid cancer    Sister #2 at 6    SURGICAL HISTORY: Past Surgical History:  Procedure Laterality Date   ABDOMINAL HYSTERECTOMY  2004   Partial   BREAST BIOPSY Right 02/26/2024   US  RT  BREAST BX W LOC DEV EA ADD LESION IMG BX SPEC US  GUIDE 02/26/2024 [facility]   BREAST BIOPSY Right 02/26/2024   US  RT BREAST BX W LOC DEV 1ST LESION IMG BX SPEC US  GUIDE 02/26/2024 [facility]   CESAREAN SECTION  1996 2000   PORTACATH PLACEMENT N/A 03/19/2024   Procedure: INSERTION TUNNELED CENTRAL VENOUS DEVICE PORT LEFT  SUBCLAVIAN WITH ULTRASOUND GUIDANCE;  Surgeon: Ebbie Cough, MD;  Location: [facility];  Service: General;  Laterality: N/A;   TUBAL LIGATION  2000    SOCIAL HISTORY: Social History   Socioeconomic History   Marital status: Married    Spouse name: Not on file   Number of children: Not on file   Years of education: Not on file   Highest education level: Not on file  Occupational History   Not on file  Tobacco Use   Smoking status: Never   Smokeless tobacco: Never  Vaping Use   Vaping status: Some Days   Substances: Nicotine  Substance and Sexual Activity   Alcohol use: Yes    Alcohol/week: 1.0 standard drink of alcohol    Types: 1 Glasses of wine per week   Drug use: Yes    Types: Marijuana    Comment: gummies   Sexual activity: Not on file  Other Topics Concern   Not on file  Social History Narrative   Not on file   Social Drivers of Health   Tobacco Use: Low Risk (05/22/2024)   Patient History    Smoking Tobacco Use: Never    Smokeless Tobacco Use: Never    Passive Exposure: Not on file  Financial Resource Strain: Low Risk (04/10/2024)   Overall Financial Resource Strain (CARDIA)    Difficulty of Paying Living Expenses: Not hard at all  Food Insecurity: No Food Insecurity (04/10/2024)   Epic    Worried About Programme Researcher, Broadcasting/film/video in the Last Year: Never true    Ran Out of Food in the Last Year: Never true  Transportation Needs: No Transportation Needs (04/10/2024)   Epic    Lack of Transportation (Medical): No    Lack of Transportation (Non-Medical): No  Physical Activity: Insufficiently Active (04/10/2024)   Exercise Vital Sign    Days of Exercise per Week: 2 days    Minutes of Exercise per Session: 30 min  Stress: No Stress Concern Present (04/10/2024)   [employer] of Occupational Health - Occupational Stress Questionnaire    Feeling of Stress: Not at all  Social Connections: Moderately Isolated (04/10/2024)   Social  Connection and Isolation Panel    Frequency of Communication with Friends and Family: More than three times a week    Frequency of Social Gatherings with Friends and Family: Once a week    Attends Religious Services: Never    Database Administrator or Organizations: No    Attends Banker Meetings: Never    Marital Status: Married  Catering Manager Violence: Not At Risk (04/10/2024)   Epic    Fear of Current or Ex-Partner: No    Emotionally Abused: No    Physically Abused: No    Sexually Abused: No  Depression (PHQ2-9): Low Risk (04/10/2024)   Depression (PHQ2-9)    PHQ-2 Score: 0  Alcohol Screen: Low Risk (04/10/2024)   Alcohol Screen    Last Alcohol Screening Score (AUDIT): 1  Housing: Unknown (04/10/2024)   Epic    Unable to Pay for Housing in the Last Year: No  Number of Times Moved in the Last Year: Not on file    Homeless in the Last Year: No  Utilities: Not At Risk (04/10/2024)   Epic    Threatened with loss of utilities: No  Health Literacy: Adequate Health Literacy (04/10/2024)   B1300 Health Literacy    Frequency of need for help with medical instructions: Never    FAMILY HISTORY: Family History  Problem Relation Age of Onset   Arthritis Mother    Asthma Mother    Varicose Veins Mother    Heart disease Mother    Hypertension Mother    Hypertension Father    Heart disease Father    Breast cancer Sister 73   Heart disease Sister 70   Thyroid cancer Sister 22   Cancer Paternal Grandfather        dx 50+ Unknown Type of Cancer   Hypertension Half-Brother    Cancer Paternal Uncle        dx 50+ Unknown Type of Cancer   Cancer Paternal Uncle        dx 50+ Unknown Type of Cancer   Cancer Paternal Uncle        dx 50+ Unknown Type of Cancer    Review of Systems  Constitutional:  Positive for fatigue. Negative for appetite change, chills, fever and unexpected weight change.  HENT:   Negative for hearing loss, lump/mass and trouble swallowing.    Eyes:  Negative for eye problems and icterus.  Respiratory:  Negative for chest tightness, cough and shortness of breath.   Cardiovascular:  Negative for chest pain, leg swelling and palpitations.  Gastrointestinal:  Negative for abdominal distention, abdominal pain, constipation, diarrhea, nausea and vomiting.  Endocrine: Negative for hot flashes.  Genitourinary:  Negative for difficulty urinating.   Musculoskeletal:  Negative for arthralgias.  Skin:  Negative for itching and rash.  Neurological:  Negative for dizziness, extremity weakness, headaches and numbness.  Hematological:  Negative for adenopathy. Does not bruise/bleed easily.  Psychiatric/Behavioral:  Negative for depression. The patient is not nervous/anxious.       PHYSICAL EXAMINATION    There were no vitals filed for this visit.  Physical Exam Constitutional:      General: She is not in acute distress.    Appearance: Normal appearance. She is not toxic-appearing.  HENT:     Head: Normocephalic and atraumatic.     Mouth/Throat:     Mouth: Mucous membranes are moist.     Pharynx: Oropharynx is clear. No oropharyngeal exudate or posterior oropharyngeal erythema.  Eyes:     General: No scleral icterus. Cardiovascular:     Rate and Rhythm: Normal rate and regular rhythm.     Pulses: Normal pulses.     Heart sounds: Normal heart sounds.  Pulmonary:     Effort: Pulmonary effort is normal.     Breath sounds: Normal breath sounds.  Abdominal:     General: Abdomen is flat. Bowel sounds are normal. There is no distension.     Palpations: Abdomen is soft.     Tenderness: There is no abdominal tenderness.  Musculoskeletal:        General: No swelling.     Cervical back: Neck supple.  Lymphadenopathy:     Cervical: No cervical adenopathy.  Skin:    General: Skin is warm and dry.     Findings: No rash.  Neurological:     General: No focal deficit present.     Mental Status: She is alert.  Psychiatric:  Mood and Affect: Mood normal.        Behavior: Behavior normal.     LABORATORY DATA:  CBC    Component Value Date/Time   WBC 6.6 05/22/2024 0926   RBC 3.32 (L) 05/22/2024 0926   HGB 11.4 (L) 05/22/2024 0926   HGB 14.0 02/28/2019 1159   HCT 34.1 (L) 05/22/2024 0926   HCT 42.1 02/28/2019 1159   PLT 234 05/22/2024 0926   PLT 236 02/28/2019 1159   MCV 102.7 (H) 05/22/2024 0926   MCV 100 (H) 02/28/2019 1159   MCH 34.3 (H) 05/22/2024 0926   MCHC 33.4 05/22/2024 0926   RDW 16.0 (H) 05/22/2024 0926   RDW 11.6 (L) 02/28/2019 1159   LYMPHSABS 2.2 05/22/2024 0926   MONOABS 0.7 05/22/2024 0926   EOSABS 0.0 05/22/2024 0926   BASOSABS 0.0 05/22/2024 0926    CMP     Component Value Date/Time   NA 140 05/22/2024 0926   NA 139 02/28/2019 1159   K 3.3 (L) 05/22/2024 0926   CL 105 05/22/2024 0926   CO2 24 05/22/2024 0926   GLUCOSE 129 (H) 05/22/2024 0926   BUN 15 05/22/2024 0926   BUN 12 02/28/2019 1159   CREATININE 0.64 05/22/2024 0926   CALCIUM 8.8 (L) 05/22/2024 0926   PROT 5.9 (L) 05/22/2024 0926   PROT 6.4 02/28/2019 1159   ALBUMIN 3.9 05/22/2024 0926   ALBUMIN 4.3 02/28/2019 1159   AST 33 05/22/2024 0926   ALT 33 05/22/2024 0926   ALKPHOS 96 05/22/2024 0926   BILITOT <0.2 05/22/2024 0926   GFRNONAA >60 05/22/2024 0926   GFRAA 96 02/28/2019 1159     ASSESSMENT and THERAPY PLAN:    Assessment and Plan Assessment & Plan HER2 positive breast cancer Undergoing chemotherapy with TCHP with no acute contraindications identified. - Proceeded with chemotherapy as scheduled.  Edema secondary to chemotherapy Persistent lower extremity edema and weight gain due to fluid retention from chemotherapy. - Refilled furosemide  prescription for edema management. - Reinforced daily use of compression stockings. - Reviewed normal repeat echocardiogram findings.  Hypokalemia At risk for hypokalemia due to furosemide  therapy. Informed about potassium supplementation and dietary  intake. - Prescribed potassium supplementation to be taken on days she uses furosemide . - Provided education on potassium management and symptoms of hypokalemia.   RTC in 3 weeks for labs, f/u, and her next treatment.     All questions were answered. The patient knows to call the clinic with any problems, questions or concerns. We can certainly see the patient much sooner if necessary.  Total encounter time:30 minutes*in face-to-face visit time, chart review, lab review, care coordination, order entry, and documentation of the encounter time.    Morna Kendall, NP 05/24/2024 4:11 PM Medical Oncology and Hematology [facility] [address] [place], [place] [address] Tel. [phone]    Fax. [phone]  *Total Encounter Time as defined by the Centers for Medicare and Medicaid Services includes, in addition to the face-to-face time of a patient visit (documented in the note above) non-face-to-face time: obtaining and reviewing outside history, ordering and reviewing medications, tests or procedures, care coordination (communications with other health care professionals or caregivers) and documentation in the medical record.

## 2024-05-28 ENCOUNTER — Encounter: Payer: Self-pay | Admitting: *Deleted

## 2024-06-05 ENCOUNTER — Other Ambulatory Visit: Payer: Self-pay | Admitting: Hematology and Oncology

## 2024-06-06 ENCOUNTER — Other Ambulatory Visit: Payer: Self-pay

## 2024-06-11 NOTE — Assessment & Plan Note (Signed)
 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  Axillary tail mass ER 40%, PR 30%, HER2 positive, Ki-67 50%    Recommendation based on multidisciplinary tumor board: 1. Neoadjuvant chemotherapy with TCH Perjeta  6 cycles followed by Herceptin  Perjeta  maintenance versus Kadcyla maintenance (based on response to neoadjuvant chemo) for 1 year 2. Followed by breast conserving surgeries with targeted lymph node dissection 3. Followed by adjuvant radiation therapy if patient had lumpectomy 4.  Antiestrogen therapy based on final pathology --------------------------------------------------------------------------------------------------------------- 03/13/24:Breast MRI: Rt Breast mass 2.4 cm (2 masses 1.8 cm and 1.4 cm), 0.5 cm, LN 2.5 cm, 1.3 cm and several other LN 03/15/24: Rt breast primary with rt axillary, sub pectoral and Int mamm nodes, 7 mm Rt hepatic hemangioma, ? Colitis and 4 mm Lt lung nodule, Bone scan Neg ECHO: EF 55-60%   Current treatment: Cycle 4 TCHP   Chemo toxicities: Mouth sores Fatigue Mild diarrhea Low neutrophil count: Improved with lower dosage of chemotherapy Skin rash: Resolved with topical cream Fatigue Vertigo-like symptoms   Denied nausea vomiting or bone pain   Return to clinic in 3 weeks for cycle 5

## 2024-06-12 ENCOUNTER — Inpatient Hospital Stay: Admitting: Hematology and Oncology

## 2024-06-12 ENCOUNTER — Inpatient Hospital Stay

## 2024-06-12 ENCOUNTER — Other Ambulatory Visit (HOSPITAL_COMMUNITY): Payer: Self-pay

## 2024-06-12 VITALS — BP 116/72 | HR 83 | Temp 97.2°F | Resp 16 | Wt 125.5 lb

## 2024-06-12 DIAGNOSIS — Z170 Estrogen receptor positive status [ER+]: Secondary | ICD-10-CM

## 2024-06-12 DIAGNOSIS — C50611 Malignant neoplasm of axillary tail of right female breast: Secondary | ICD-10-CM | POA: Diagnosis not present

## 2024-06-12 DIAGNOSIS — Z5111 Encounter for antineoplastic chemotherapy: Secondary | ICD-10-CM | POA: Diagnosis not present

## 2024-06-12 LAB — CMP (CANCER CENTER ONLY)
ALT: 31 U/L (ref 0–44)
AST: 28 U/L (ref 15–41)
Albumin: 4.1 g/dL (ref 3.5–5.0)
Alkaline Phosphatase: 100 U/L (ref 38–126)
Anion gap: 13 (ref 5–15)
BUN: 15 mg/dL (ref 6–20)
CO2: 22 mmol/L (ref 22–32)
Calcium: 9 mg/dL (ref 8.9–10.3)
Chloride: 102 mmol/L (ref 98–111)
Creatinine: 0.67 mg/dL (ref 0.44–1.00)
GFR, Estimated: >60 mL/min
Glucose, Bld: 195 mg/dL — ABNORMAL HIGH (ref 70–99)
Potassium: 4.6 mmol/L (ref 3.5–5.1)
Sodium: 138 mmol/L (ref 135–145)
Total Bilirubin: 0.2 mg/dL (ref 0.0–1.2)
Total Protein: 6.3 g/dL — ABNORMAL LOW (ref 6.5–8.1)

## 2024-06-12 LAB — CBC WITH DIFFERENTIAL (CANCER CENTER ONLY)
Abs Immature Granulocytes: 0.01 K/uL (ref 0.00–0.07)
Basophils Absolute: 0 K/uL (ref 0.0–0.1)
Basophils Relative: 0 %
Eosinophils Absolute: 0 K/uL (ref 0.0–0.5)
Eosinophils Relative: 0 %
HCT: 36 % (ref 36.0–46.0)
Hemoglobin: 12.1 g/dL (ref 12.0–15.0)
Immature Granulocytes: 0 %
Lymphocytes Relative: 10 %
Lymphs Abs: 0.6 K/uL — ABNORMAL LOW (ref 0.7–4.0)
MCH: 35.1 pg — ABNORMAL HIGH (ref 26.0–34.0)
MCHC: 33.6 g/dL (ref 30.0–36.0)
MCV: 104.3 fL — ABNORMAL HIGH (ref 80.0–100.0)
Monocytes Absolute: 0.1 K/uL (ref 0.1–1.0)
Monocytes Relative: 2 %
Neutro Abs: 5.2 K/uL (ref 1.7–7.7)
Neutrophils Relative %: 88 %
Platelet Count: 243 K/uL (ref 150–400)
RBC: 3.45 MIL/uL — ABNORMAL LOW (ref 3.87–5.11)
RDW: 15.6 % — ABNORMAL HIGH (ref 11.5–15.5)
WBC Count: 6 K/uL (ref 4.0–10.5)
nRBC: 0 % (ref 0.0–0.2)

## 2024-06-12 MED ORDER — SODIUM CHLORIDE 0.9 % IV SOLN
458.0000 mg | Freq: Once | INTRAVENOUS | Status: AC
  Administered 2024-06-12: 460 mg via INTRAVENOUS
  Filled 2024-06-12: qty 46

## 2024-06-12 MED ORDER — POTASSIUM CHLORIDE CRYS ER 10 MEQ PO TBCR
10.0000 meq | EXTENDED_RELEASE_TABLET | Freq: Every day | ORAL | 0 refills | Status: AC
  Filled 2024-06-12: qty 30, 30d supply, fill #0

## 2024-06-12 MED ORDER — SODIUM CHLORIDE 0.9 % IV SOLN
420.0000 mg | Freq: Once | INTRAVENOUS | Status: AC
  Administered 2024-06-12: 420 mg via INTRAVENOUS
  Filled 2024-06-12: qty 14

## 2024-06-12 MED ORDER — TRASTUZUMAB-ANNS CHEMO 150 MG IV SOLR
6.0000 mg/kg | Freq: Once | INTRAVENOUS | Status: AC
  Administered 2024-06-12: 300 mg via INTRAVENOUS
  Filled 2024-06-12: qty 14.29

## 2024-06-12 MED ORDER — PALONOSETRON HCL INJECTION 0.25 MG/5ML
0.2500 mg | Freq: Once | INTRAVENOUS | Status: AC
  Administered 2024-06-12: 0.25 mg via INTRAVENOUS
  Filled 2024-06-12: qty 5

## 2024-06-12 MED ORDER — DEXAMETHASONE SOD PHOSPHATE PF 10 MG/ML IJ SOLN
10.0000 mg | Freq: Once | INTRAMUSCULAR | Status: AC
  Administered 2024-06-12: 10 mg via INTRAVENOUS
  Filled 2024-06-12: qty 1

## 2024-06-12 MED ORDER — DIPHENHYDRAMINE HCL 25 MG PO CAPS
25.0000 mg | ORAL_CAPSULE | Freq: Once | ORAL | Status: AC
  Administered 2024-06-12: 25 mg via ORAL
  Filled 2024-06-12: qty 1

## 2024-06-12 MED ORDER — SODIUM CHLORIDE 0.9 % IV SOLN: INTRAVENOUS | Status: DC

## 2024-06-12 MED ORDER — APREPITANT 130 MG/18ML IV EMUL
130.0000 mg | Freq: Once | INTRAVENOUS | Status: AC
  Administered 2024-06-12: 130 mg via INTRAVENOUS
  Filled 2024-06-12: qty 18

## 2024-06-12 MED ORDER — SODIUM CHLORIDE 0.9 % IV SOLN
65.0000 mg/m2 | Freq: Once | INTRAVENOUS | Status: AC
  Administered 2024-06-12: 98 mg via INTRAVENOUS
  Filled 2024-06-12: qty 9.8

## 2024-06-12 MED ORDER — ACETAMINOPHEN 325 MG PO TABS
650.0000 mg | ORAL_TABLET | Freq: Once | ORAL | Status: AC
  Administered 2024-06-12: 650 mg via ORAL
  Filled 2024-06-12: qty 2

## 2024-06-12 NOTE — Patient Instructions (Signed)
 [facility] - A DEPT OF [facility]  Discharge Instructions: Thank you for choosing [facility] to provide your oncology and hematology care.   If you have a lab appointment with the Cancer Center, please go directly to the Cancer Center and check in at the registration area.   Wear comfortable clothing and clothing appropriate for easy access to any Portacath or PICC line.   We strive to give you quality time with your provider. You may need to reschedule your appointment if you arrive late (15 or more minutes).  Arriving late affects you and other patients whose appointments are after yours.  Also, if you miss three or more appointments without notifying the office, you may be dismissed from the clinic at the providers discretion.      For prescription refill requests, have your pharmacy contact our office and allow 72 hours for refills to be completed.    Today you received the following chemotherapy and/or immunotherapy agents: Trastuzumab -anns (Kanjinti ), Pertuzumab  (Perjeta ), Docetaxel  (Taxotere ), & Carboplatin  (Paraplatin )       To help prevent nausea and vomiting after your treatment, we encourage you to take your nausea medication as directed.  BELOW ARE SYMPTOMS THAT SHOULD BE REPORTED IMMEDIATELY: *FEVER GREATER THAN 100.4 F (38 C) OR HIGHER *CHILLS OR SWEATING *NAUSEA AND VOMITING THAT IS NOT CONTROLLED WITH YOUR NAUSEA MEDICATION *UNUSUAL SHORTNESS OF BREATH *UNUSUAL BRUISING OR BLEEDING *URINARY PROBLEMS (pain or burning when urinating, or frequent urination) *BOWEL PROBLEMS (unusual diarrhea, constipation, pain near the anus) TENDERNESS IN MOUTH AND THROAT WITH OR WITHOUT PRESENCE OF ULCERS (sore throat, sores in mouth, or a toothache) UNUSUAL RASH, SWELLING OR PAIN  UNUSUAL VAGINAL DISCHARGE OR ITCHING   Items with * indicate a potential emergency and should be followed up as soon as possible or go to the Emergency Department  if any problems should occur.  Please show the CHEMOTHERAPY ALERT CARD or IMMUNOTHERAPY ALERT CARD at check-in to the Emergency Department and triage nurse.  Should you have questions after your visit or need to cancel or reschedule your appointment, please contact [facility] - A DEPT OF [facility]  Dept: [phone]  and follow the prompts.  Office hours are 8:00 a.m. to 4:30 p.m. Monday - Friday. Please note that voicemails left after 4:00 p.m. may not be returned until the following business day.  We are closed weekends and major holidays. You have access to a nurse at all times for urgent questions. Please call the main number to the clinic Dept: [phone] and follow the prompts.   For any non-urgent questions, you may also contact your provider using MyChart. We now offer e-Visits for anyone 63 and older to request care online for non-urgent symptoms. For details visit [email or web address].   Also download the MyChart app! Go to the app store, search MyChart, open the app, select [place], and log in with your MyChart username and password.

## 2024-06-12 NOTE — Progress Notes (Signed)
 "  Patient Care Team: Prentiss Spanner, MD as PCP - General Gerome, Devere HERO, RN as Oncology Nurse Navigator Tyree Nanetta SAILOR, RN as Oncology Nurse Navigator Ebbie Cough, MD as Consulting Physician (General Surgery) Odean Potts, MD as Consulting Physician (Hematology and Oncology) Shannon Agent, MD as Consulting Physician (Radiation Oncology)  DIAGNOSIS:  Encounter Diagnosis  Name Primary?   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  Axillary tail mass ER 40%, PR 30%, HER2 positive, Ki-67 50%   03/06/2024 Cancer Staging   Staging form: Breast, AJCC 8th Edition - Clinical: Stage IIB (cT2, cN1, cM0, G2, ER-, PR-, HER2+) - Signed by Odean Potts, MD on 03/06/2024 Stage prefix: Initial diagnosis Histologic grading system: 3 grade system   03/20/2024 -  Chemotherapy   Patient is on Treatment Plan : BREAST  Docetaxel  + Carboplatin  + Trastuzumab  + Pertuzumab   (TCHP) q21d        CHIEF COMPLIANT:   HISTORY OF PRESENT ILLNESS: Discussed the use of AI scribe software for clinical note transcription with the patient, who gave verbal consent to proceed.  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  She is in her fifth of six planned cycles of neoadjuvant TCHP. Side effects have been stable and manageable with her current routine.  She has nocturnal paresthesias in her hands lasting about 30 minutes that resolve with movement and do not occur during the day.  She has a mild pruritic dry rash over her back, hips, and sides that gradually fades and is not papular or ulcerated. Pruritus is persistent but not severe.  She has transient morning stiffness and stiffness after prolonged sitting that improves with ambulation.  She has intermittent diarrhea with some days of more than three bowel movements but maintains oral intake and hydration and has no emesis.  She has lower extremity edema, worse after prolonged standing and improved with leg elevation.  She continues daily potassium supplementation for prior hypokalemia.  She notes intermittent cognitive difficulties that she attributes to chemotherapy.  May 01, 2024: Follow-up for neoadjuvant TCHP chemotherapy (cycle 3) for Stage IIB HER2-positive, ER-positive right breast invasive ductal carcinoma. Assessed for chemotherapy-related toxicities including pruritic rash, severe diarrhea, perianal irritation, intermittent rashes, nocturnal diaphoresis, leg swelling, morning stiffness, fatigue, vertigo, dysgeusia, and oral discomfort. Managed toxicities with topical treatments, furosemide , and supportive care; labs showed mild anemia and elevated glucose. Plan to continue chemotherapy with close monitoring of blood counts, renal/hepatic function, and management of ongoing toxicities.     ALLERGIES:  is allergic to penicillins, sulfa antibiotics, metronidazole , and morphine.  MEDICATIONS:  Current Outpatient Medications  Medication Sig Dispense Refill   dexamethasone  (DECADRON ) 4 MG tablet Take 1 tablet day before chemo  and 1  tablet day after chemo with food 30 tablet 1   furosemide  (LASIX ) 20 MG tablet Take 1 tablet (20 mg total) by mouth daily as needed. 30 tablet 0   lidocaine -prilocaine  (EMLA ) cream Apply to affected area once 30 g 3   magic mouthwash (nystatin , lidocaine , diphenhydrAMINE , alum & mag hydroxide) suspension Swish and swallow 5 mLs by mouth 3 (three) times daily. 240 mL 0   nitrofurantoin  (FURADANTIN ) 25 MG/5ML suspension Take 50 mg by mouth 4 (four) times daily.     omeprazole (PRILOSEC OTC) 20 MG tablet Take 20 mg by mouth daily.     ondansetron  (ZOFRAN ) 8 MG tablet Take 1 tablet (8 mg total) by mouth every 8 (eight) hours as needed for nausea or vomiting. Start on the third day after chemotherapy. 30 tablet 1   phenazopyridine (PYRIDIUM) 100 MG tablet Take 100 mg by mouth 3 (three) times daily as needed for pain.     potassium chloride  (KLOR-CON  M) 10 MEQ tablet Take 1 tablet (10 mEq total) by mouth daily. 30 tablet 0   prochlorperazine  (COMPAZINE ) 10 MG tablet Take 1 tablet (10 mg total) by mouth every 6 (six) hours as needed for nausea or vomiting. 30 tablet 1   No current facility-administered medications for this visit.    PHYSICAL EXAMINATION: ECOG PERFORMANCE STATUS: 1 - Symptomatic but completely ambulatory  Vitals:   06/12/24 0947  BP: 116/72  Pulse: 83  Resp: 16  Temp: (!) 97.2 F (36.2 C)  SpO2: 95%   Filed Weights   06/12/24 0947  Weight: 125 lb 8 oz (56.9 kg)    Physical Exam   (exam performed in the presence of a chaperone)  LABORATORY DATA:  I have reviewed the data as listed    Latest Ref Rng & Units 05/22/2024    9:26 AM 05/01/2024   10:29 AM 04/10/2024   10:04 AM  CMP  Glucose 70 - 99 mg/dL 870  874  856   BUN 6 - 20 mg/dL 15  18  16    Creatinine 0.44 - 1.00 mg/dL 9.35  9.41  9.28   Sodium 135 - 145 mmol/L 140  138  138   Potassium 3.5 - 5.1 mmol/L 3.3  3.6  3.9   Chloride 98 - 111 mmol/L 105  105  101   CO2 22 - 32 mmol/L 24  24  26    Calcium 8.9 -  10.3 mg/dL 8.8  8.9  9.2   Total Protein 6.5 - 8.1 g/dL 5.9  6.1  6.1   Total Bilirubin 0.0 - 1.2 mg/dL <9.7  0.3  0.2   Alkaline Phos 38 - 126 U/L 96  90  94   AST 15 - 41 U/L 33  39  29   ALT 0 - 44 U/L 33  45  46     Lab Results  Component Value Date   WBC 6.0 06/12/2024   HGB 12.1 06/12/2024   HCT 36.0 06/12/2024   MCV 104.3 (H) 06/12/2024   PLT 243 06/12/2024   NEUTROABS 5.2 06/12/2024    ASSESSMENT & PLAN:  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  Axillary tail mass ER 40%, PR 30%, HER2 positive, Ki-67 50%  Recommendation based on multidisciplinary tumor board: 1. Neoadjuvant chemotherapy with TCH Perjeta  6 cycles followed by Herceptin  Perjeta  maintenance versus Kadcyla maintenance (based on response to neoadjuvant chemo) for 1 year 2. Followed by breast conserving surgeries with targeted lymph node dissection 3. Followed by adjuvant radiation therapy if patient had lumpectomy 4.  Antiestrogen therapy based on final pathology --------------------------------------------------------------------------------------------------------------- 03/13/24:Breast MRI: Rt Breast mass 2.4 cm (2 masses 1.8 cm and 1.4 cm), 0.5 cm, LN 2.5 cm, 1.3 cm and several other LN 03/15/24: Rt breast primary with rt axillary, sub pectoral and Int mamm nodes, 7 mm Rt hepatic hemangioma, ? Colitis and 4 mm Lt lung nodule, Bone scan Neg ECHO: EF 55-60%   Current treatment: Cycle 5 TCHP   Chemo toxicities: Mouth sores Fatigue Mild diarrhea Low neutrophil count: Improved with lower dosage of chemotherapy Skin rash: Resolved with topical cream Fatigue Vertigo-like symptoms   Denied nausea vomiting or bone pain    Return to clinic in 3 weeks for cycle 6   Assessment & Plan Malignant neoplasm of axillary tail of right breast, estrogen receptor positive Stage IIB HER2-positive, ER-positive invasive ductal carcinoma with regional nodal involvement. Favorable response to neoadjuvant TCHP chemotherapy with tumor and nodal size reduction. Surgery indicated due to potential residual disease. Radiation and further immunotherapy contingent on surgical and pathological findings. - Continue neoadjuvant TCHP chemotherapy; complete cycle 6 unless severe toxicity develops. - Breast MRI scheduled for February 13th at [facility] to assess chemotherapy response. - Coordinate surgical follow-up post-MRI; review findings to determine need for radiation and immunotherapy. - Continue multidisciplinary care with oncology and breast surgery teams.  Chemotherapy-induced adverse effects Experiencing mild to moderate TCHP-related toxicities: peripheral neuropathy, rash, diarrhea, mucositis, edema, fatigue, anemia, and neutropenia. Managed with supportive care and dose adjustments. Stable lab counts and organ function. - Monitor for neuropathy, rash, diarrhea, mucositis, edema, fatigue, neutropenia, anemia, taste changes, vertigo, and night sweats; provide supportive care as needed. - Monitor CBC, renal, and hepatic function prior to each cycle. - Advise hydration, nutrition, leg elevation for edema; use compression stockings and intermittent furosemide  as needed. - Continue topical and supportive treatments for rash, diarrhea, and mucositis. - Adjust chemotherapy dosing or hold therapy for grade 3/4 toxicities per protocol. - Provide education on symptom monitoring and indications to contact clinic.  Hypokalemia Hypokalemia managed with daily potassium supplementation. Renal and hepatic function stable. - Potassium supplementation refill (60 tablets, no refills) dispensed at cancer center. - Monitor serum potassium and electrolytes  with each chemotherapy cycle. - Reassess potassium supplementation three weeks after final chemotherapy cycle and adjust as indicated.   No orders of the defined types were placed in this encounter.  The patient has a good understanding of the overall plan. she agrees with it. she will call with any problems that may develop before the next visit here.  I personally spent a total of 30 minutes in the care of the patient today including preparing to see the patient, getting/reviewing separately obtained history, performing a medically appropriate exam/evaluation, counseling and educating, placing orders, referring and communicating with other health care professionals, documenting clinical information in the EHR, independently interpreting results, communicating results, and coordinating care.   Viinay K Emika Tiano, MD 06/12/24    "

## 2024-06-14 ENCOUNTER — Inpatient Hospital Stay

## 2024-06-14 VITALS — BP 115/88 | HR 76 | Resp 17

## 2024-06-14 DIAGNOSIS — Z5111 Encounter for antineoplastic chemotherapy: Secondary | ICD-10-CM | POA: Diagnosis not present

## 2024-06-14 DIAGNOSIS — C50611 Malignant neoplasm of axillary tail of right female breast: Secondary | ICD-10-CM

## 2024-06-14 MED ORDER — PEGFILGRASTIM-JMDB 6 MG/0.6ML ~~LOC~~ SOSY
6.0000 mg | PREFILLED_SYRINGE | Freq: Once | SUBCUTANEOUS | Status: AC
  Administered 2024-06-14: 6 mg via SUBCUTANEOUS
  Filled 2024-06-14: qty 0.6

## 2024-06-17 ENCOUNTER — Telehealth: Payer: Self-pay | Admitting: *Deleted

## 2024-06-17 NOTE — Telephone Encounter (Signed)
 Received call from patient stating she got a call from [facility] health billing stating she was going to get sent to collections if she didn't pay her bill. She relayed that she has been paying her bill and that this month she paid twice.  She states she is doing the best she can.  I informed her I would send a message to our billing to reach for an update. Also gave her contact information on Access one for maybe lower monthly payments without interest accruing interest.  Encouraged her to reach back out to me if she hadn't hear anything by the end of the week. Patient so appreciative and verbalized understanding.

## 2024-06-18 ENCOUNTER — Encounter: Payer: Self-pay | Admitting: Hematology and Oncology

## 2024-06-18 NOTE — Progress Notes (Signed)
 Called pt regarding an email I received from [facility] stating pt was going to be turned over to collections despite making payments.  I left a vm advising her to ask to speak to a supervisor in the billing department and relay what was told to her and hopefully they will be able to assist her.  I left my contact information for additional questions or concerns.

## 2024-06-19 ENCOUNTER — Telehealth: Payer: Self-pay

## 2024-06-19 NOTE — Progress Notes (Signed)
 RN called pt and she said she is unable to take the test right now because she is at work. RN instructed pt to test as soon as she is able and call in the result.Pt agreeable and verbalized understanding and stated she would call after hours. Gudena MD gave order that if the test is negative for RN to order Levoquin 500 mg, 1 daily for 7 days. Pt states it will need to be ordered at [facility] in [place], [place].

## 2024-06-19 NOTE — Telephone Encounter (Signed)
 Pt called w/ flu like sx, wet cough, congestion since 1/23 but denies fever, aches. RN consulted Gudena MD who advised pt to take a flu/covid test and report back w/ results. If the test is negative, Gudena MD will order a course of antibiotics.

## 2024-06-20 ENCOUNTER — Other Ambulatory Visit: Payer: Self-pay | Admitting: *Deleted

## 2024-06-20 MED ORDER — LEVOFLOXACIN 500 MG PO TABS: 500.0000 mg | ORAL_TABLET | Freq: Every day | ORAL | 0 refills | Status: AC

## 2024-06-20 NOTE — Progress Notes (Signed)
 Received call from pt with stating home Covid/ Flu test negative.  Pt states she continues to have sinus congestion and wet cough.  Verbal orders received and placed for pt to be prescribed Levaquin  500 mg p.o daily x7 days.  Prescription sent to pharmacy on file, pt educated and verbalized understanding.

## 2024-07-03 ENCOUNTER — Inpatient Hospital Stay

## 2024-07-03 ENCOUNTER — Inpatient Hospital Stay: Admitting: Hematology and Oncology

## 2024-07-05 ENCOUNTER — Inpatient Hospital Stay

## 2024-07-05 ENCOUNTER — Ambulatory Visit (HOSPITAL_COMMUNITY)

## 2024-08-12 ENCOUNTER — Encounter (HOSPITAL_BASED_OUTPATIENT_CLINIC_OR_DEPARTMENT_OTHER): Payer: Self-pay

## 2024-08-12 ENCOUNTER — Ambulatory Visit (HOSPITAL_BASED_OUTPATIENT_CLINIC_OR_DEPARTMENT_OTHER): Admit: 2024-08-12 | Admitting: General Surgery

## 2024-08-12 SURGERY — MASTECTOMY, SIMPLE
Anesthesia: General | Site: Chest | Laterality: Bilateral
